# Patient Record
Sex: Male | Born: 1990 | Hispanic: Yes | Marital: Married | Smoking: Never smoker
Health system: Southern US, Community
[De-identification: ages and names within clinical notes are randomized; demographics above are authoritative.]

---

## 2016-09-15 ENCOUNTER — Emergency Department
Admission: EM | Admit: 2016-09-15 | Discharge: 2016-09-15 | Disposition: A | Discharge status: Home / Self Care | Payer: Self-pay | Attending: Emergency Medicine | Admitting: Emergency Medicine

## 2016-09-15 ENCOUNTER — Emergency Department: Payer: Self-pay

## 2016-09-15 DIAGNOSIS — K2970 Gastritis, unspecified, without bleeding: Secondary | ICD-10-CM | POA: Insufficient documentation

## 2016-09-15 LAB — CBC
HEMATOCRIT: 42.9 % (ref 40.0–52.0)
Hemoglobin: 15 g/dL (ref 13.0–18.0)
MCH: 29.5 pg (ref 26.0–34.0)
MCHC: 35 g/dL (ref 32.0–36.0)
MCV: 84.3 fL (ref 80.0–100.0)
Platelets: 244 10*3/uL (ref 150–440)
RBC: 5.09 MIL/uL (ref 4.40–5.90)
RDW: 14 % (ref 11.5–14.5)
WBC: 12.1 10*3/uL — ABNORMAL HIGH (ref 3.8–10.6)

## 2016-09-15 LAB — COMPREHENSIVE METABOLIC PANEL
ALBUMIN: 4.5 g/dL (ref 3.5–5.0)
ALT: 20 U/L (ref 17–63)
ANION GAP: 8 (ref 5–15)
AST: 20 U/L (ref 15–41)
Alkaline Phosphatase: 73 U/L (ref 38–126)
BILIRUBIN TOTAL: 0.6 mg/dL (ref 0.3–1.2)
BUN: 14 mg/dL (ref 6–20)
CO2: 29 mmol/L (ref 22–32)
Calcium: 9.4 mg/dL (ref 8.9–10.3)
Chloride: 103 mmol/L (ref 101–111)
Creatinine, Ser: 1.17 mg/dL (ref 0.61–1.24)
GLUCOSE: 115 mg/dL — AB (ref 65–99)
POTASSIUM: 3.5 mmol/L (ref 3.5–5.1)
Sodium: 140 mmol/L (ref 135–145)
Total Protein: 7.6 g/dL (ref 6.5–8.1)

## 2016-09-15 LAB — TROPONIN I: Troponin I: <0.03 ng/mL (ref <0.03)

## 2016-09-15 MED ORDER — SUCRALFATE 1 G PO TABS: 1.0000 g | ORAL_TABLET | Freq: Four times a day (QID) | ORAL | 0 refills | Status: AC

## 2016-09-15 MED ORDER — FAMOTIDINE 40 MG PO TABS: 40.0000 mg | ORAL_TABLET | Freq: Every evening | ORAL | 1 refills | Status: DC

## 2016-09-15 MED ORDER — GI COCKTAIL ~~LOC~~
30.0000 mL | Freq: Once | ORAL | Status: AC
  Administered 2016-09-15: 30 mL via ORAL
  Filled 2016-09-15: qty 30

## 2016-09-15 NOTE — Discharge Instructions (Signed)
Please seek medical attention for any high fevers, chest pain, shortness of breath, change in behavior, persistent vomiting, bloody stool or any other new or concerning symptoms.  

## 2016-09-15 NOTE — ED Triage Notes (Signed)
Pt in with co left sternal chest pain for a week states worsened at 1800 yest. Hx of the same but has not seem pmd, denies any other symptoms.

## 2016-09-15 NOTE — ED Notes (Signed)
Assessment completed with help of tele-interpreter

## 2016-09-15 NOTE — ED Provider Notes (Signed)
[facility] Emergency Department Provider Note  __________________________________________   I have reviewed the triage vital signs and the nursing notes.   HISTORY  Chief Complaint Chest Pain   History limited by: Language [facility] Interpreter utilized   HPI Colton Shelton is a 26 y.o. male who presents to the emergency department today because of concern for chest pain. Located in the left chest. Pressure like. Started tonight shortly after finishing dinner. By the time of my exam it had improved. The patient states that he had an episode of similar chest pain two weeks ago. At that time it was shorter lived and not as severe.    No past medical history on file.  There are no active problems to display for this patient.   No past surgical history on file.  Prior to Admission medications   Not on File    Allergies Patient has no known allergies.  No family history on file.  Social History Denies smoking Denies alcohol  Review of Systems Constitutional: No fever/chills Eyes: No visual changes. ENT: No sore throat. Cardiovascular: Positive for chest pain. Respiratory: Denies shortness of breath. Gastrointestinal: No abdominal pain.  No nausea, no vomiting.  No diarrhea.   Genitourinary: Negative for dysuria. Musculoskeletal: Negative for back pain. Skin: Negative for rash. Neurological: Negative for headaches, focal weakness or numbness.  ____________________________________________   PHYSICAL EXAM:  VITAL SIGNS: ED Triage Vitals  Enc Vitals Group     BP 09/15/16 0029 (!) 152/88     Pulse Rate 09/15/16 0029 62     Resp 09/15/16 0029 16     Temp 09/15/16 0029 97.8 F (36.6 C)     Temp Source 09/15/16 0029 Oral     SpO2 09/15/16 0029 99 %     Weight 09/15/16 0026 195 lb (88.5 kg)     Height 09/15/16 0026 5\' 8"  (1.727 m)     Head Circumference --      Peak Flow --      Pain Score 09/15/16 0025 6   Constitutional:  Alert and oriented. Well appearing and in no distress. Eyes: Conjunctivae are normal.  ENT   Head: Normocephalic and atraumatic.   Nose: No congestion/rhinnorhea.   Mouth/Throat: Mucous membranes are moist.   Neck: No stridor. Hematological/Lymphatic/Immunilogical: No cervical lymphadenopathy. Cardiovascular: Normal rate, regular rhythm.  No murmurs, rubs, or gallops.  Respiratory: Normal respiratory effort without tachypnea nor retractions. Breath sounds are clear and equal bilaterally. No wheezes/rales/rhonchi. Gastrointestinal: Soft and minimally tender in the left upper quadrant. No rebound. No guarding.  Genitourinary: Deferred Musculoskeletal: Normal range of motion in all extremities. No lower extremity edema. Neurologic:  Normal speech and language. No gross focal neurologic deficits are appreciated.  Skin:  Skin is warm, dry and intact. No rash noted. Psychiatric: Mood and affect are normal. Speech and behavior are normal. Patient exhibits appropriate insight and judgment.  ____________________________________________    LABS (pertinent positives/negatives)  Labs Reviewed  CBC - Abnormal; Notable for the following:       Result Value   WBC 12.1 (*)    All other components within normal limits  COMPREHENSIVE METABOLIC PANEL - Abnormal; Notable for the following:    Glucose, Bld 115 (*)    All other components within normal limits  TROPONIN I    ____________________________________________   EKG  I, Phineas Semen, attending physician, personally viewed and interpreted this EKG  EKG Time: 0022 Rate: 70 Rhythm: normal sinus rhythm Axis: normal Intervals: qtc  408 QRS: narrow ST changes: no st elevation Impression: normal ekg   ____________________________________________    RADIOLOGY  CXR IMPRESSION:  No acute cardiopulmonary process seen.       ____________________________________________   PROCEDURES  Procedures  ____________________________________________   INITIAL IMPRESSION / ASSESSMENT AND PLAN / ED COURSE  Pertinent labs & imaging results that were available during my care of the patient were reviewed by me and considered in my medical decision making (see chart for details).  Patient presented to the emergency department today because of concerns for chest pain. Patient's workup here without concerning findings. He did feel improvement after GI cocktail. At this point I think gastritis likely. Will discharge with sucralfate and Pepcid. Will give patient primary care follow-up information.  ____________________________________________   FINAL CLINICAL IMPRESSION(S) / ED DIAGNOSES  Final diagnoses:  Gastritis, presence of bleeding unspecified, unspecified chronicity, unspecified gastritis type     Note: This dictation was prepared with Dragon dictation. Any transcriptional errors that result from this process are unintentional     Phineas Semen, MD 09/15/16 [phone]

## 2016-12-04 ENCOUNTER — Emergency Department
Admission: EM | Admit: 2016-12-04 | Discharge: 2016-12-04 | Disposition: A | Discharge status: Home / Self Care | Payer: Self-pay | Attending: Student in an Organized Health Care Education/Training Program | Admitting: Student in an Organized Health Care Education/Training Program

## 2016-12-04 ENCOUNTER — Emergency Department: Payer: Self-pay

## 2016-12-04 ENCOUNTER — Encounter: Payer: Self-pay | Admitting: Emergency Medicine

## 2016-12-04 DIAGNOSIS — Z79899 Other long term (current) drug therapy: Secondary | ICD-10-CM | POA: Insufficient documentation

## 2016-12-04 DIAGNOSIS — R079 Chest pain, unspecified: Secondary | ICD-10-CM | POA: Insufficient documentation

## 2016-12-04 DIAGNOSIS — R002 Palpitations: Secondary | ICD-10-CM | POA: Insufficient documentation

## 2016-12-04 DIAGNOSIS — Z563 Stressful work schedule: Secondary | ICD-10-CM | POA: Insufficient documentation

## 2016-12-04 DIAGNOSIS — R0602 Shortness of breath: Secondary | ICD-10-CM | POA: Insufficient documentation

## 2016-12-04 LAB — BASIC METABOLIC PANEL
ANION GAP: 9 (ref 5–15)
BUN: 12 mg/dL (ref 6–20)
CO2: 28 mmol/L (ref 22–32)
Calcium: 9.3 mg/dL (ref 8.9–10.3)
Chloride: 99 mmol/L — ABNORMAL LOW (ref 101–111)
Creatinine, Ser: 0.96 mg/dL (ref 0.61–1.24)
GLUCOSE: 122 mg/dL — AB (ref 65–99)
POTASSIUM: 3.5 mmol/L (ref 3.5–5.1)
SODIUM: 136 mmol/L (ref 135–145)

## 2016-12-04 LAB — CBC
HEMATOCRIT: 45.1 % (ref 40.0–52.0)
HEMOGLOBIN: 15.4 g/dL (ref 13.0–18.0)
MCH: 28.9 pg (ref 26.0–34.0)
MCHC: 34.1 g/dL (ref 32.0–36.0)
MCV: 84.7 fL (ref 80.0–100.0)
Platelets: 266 10*3/uL (ref 150–440)
RBC: 5.33 MIL/uL (ref 4.40–5.90)
RDW: 13.7 % (ref 11.5–14.5)
WBC: 8.9 10*3/uL (ref 3.8–10.6)

## 2016-12-04 LAB — TROPONIN I: Troponin I: <0.03 ng/mL (ref <0.03)

## 2016-12-04 NOTE — ED Notes (Signed)
Pt reports that he is chest pain over the left breast - he states it started early this morning - denies N/V - denies shortness of breath

## 2016-12-04 NOTE — ED Notes (Signed)
Paper copy of discharge instructions signed

## 2016-12-04 NOTE — ED Notes (Signed)
To Radiology via w/c

## 2016-12-04 NOTE — ED Triage Notes (Signed)
Pt reports this am he started with some chest pain. Pt reports pain was pressure like in nature and he felt like he needed to use the BR. Pt reports became hot and nauseated and felt like he could not breathe. Pt reports scared him and he called EMS. Pt reports currently he has no pain but still worried.

## 2016-12-04 NOTE — ED Provider Notes (Signed)
[facility] Emergency Department Provider Note    First MD Initiated Contact with Patient 12/04/16 1319     (approximate)  I have reviewed the triage vital signs and the nursing notes.   HISTORY  Chief Complaint Chest Pain; Shortness of Breath; and Nausea    HPI Colton Shelton is a 26 y.o. male presents with sudden onset chest pain and pressure with palpitations that occurred this morning while he is at work.  Patient states that he has had this episode once before several months ago similar nature.  States that the pain lasted roughly 2-3 minutes and radiated to his left shoulder especially with a feeling of panic.  He spoke to his boss and request to be evaluated by EMS.  Was noted to be hypertensive at that time brought to the ER.  States that he has been working double shift over 13 hours/day for the past several weeks and does feel more stressed.  Denies any abdominal pain.  No nausea or vomiting.  No numbness or tingling.  Does not smoke.  No history of hypertension, hyperlipidemia, diabetes.  Does not drink alcohol.  No cough or fevers.  History reviewed. No pertinent past medical history. No family history on file. History reviewed. No pertinent surgical history. There are no active problems to display for this patient.     Prior to Admission medications   Medication Sig Start Date End Date Taking? Authorizing Provider  famotidine (PEPCID) 40 MG tablet Take 1 tablet (40 mg total) by mouth every evening. 09/15/16 09/15/17  Phineas Semen, MD  sucralfate (CARAFATE) 1 g tablet Take 1 tablet (1 g total) by mouth 4 (four) times daily. 09/15/16   Phineas Semen, MD    Allergies Patient has no known allergies.    Social History Social History   Tobacco Use  . Smoking status: Not on file  Substance Use Topics  . Alcohol use: Not on file  . Drug use: Not on file    Review of Systems Patient denies headaches, rhinorrhea, blurry  vision, numbness, shortness of breath, chest pain, edema, cough, abdominal pain, nausea, vomiting, diarrhea, dysuria, fevers, rashes or hallucinations unless otherwise stated above in HPI. ____________________________________________   PHYSICAL EXAM:  VITAL SIGNS: Vitals:   12/04/16 1004  BP: (!) 159/92  Pulse: 77  Resp: 20  Temp: 98.1 F (36.7 C)  SpO2: 98%    Constitutional: Alert and oriented. Well appearing and in no acute distress. Eyes: Conjunctivae are normal.  Head: Atraumatic. Nose: No congestion/rhinnorhea. Mouth/Throat: Mucous membranes are moist.   Neck: No stridor. Painless ROM.  Cardiovascular: Normal rate, regular rhythm. Grossly normal heart sounds.  Good peripheral circulation. Respiratory: Normal respiratory effort.  No retractions. Lungs CTAB. Gastrointestinal: Soft and nontender. No distention. No abdominal bruits. No CVA tenderness. Genitourinary:  Musculoskeletal: No lower extremity tenderness nor edema.  No joint effusions. Neurologic:  Normal speech and language. No gross focal neurologic deficits are appreciated. No facial droop Skin:  Skin is warm, dry and intact. No rash noted. Psychiatric: Mood and affect are normal. Speech and behavior are normal.  ____________________________________________   LABS (all labs ordered are listed, but only abnormal results are displayed)  Results for orders placed or performed during the hospital encounter of 12/04/16 (from the past 24 hour(s))  Basic metabolic panel     Status: Abnormal   Collection Time: 12/04/16 10:01 AM  Result Value Ref Range   Sodium 136 135 - 145 mmol/L   Potassium  3.5 3.5 - 5.1 mmol/L   Chloride 99 (L) 101 - 111 mmol/L   CO2 28 22 - 32 mmol/L   Glucose, Bld 122 (H) 65 - 99 mg/dL   BUN 12 6 - 20 mg/dL   Creatinine, Ser 1.610.96 0.61 - 1.24 mg/dL   Calcium 9.3 8.9 - 09.610.3 mg/dL   GFR calc non Af Amer >60 >60 mL/min   GFR calc Af Amer >60 >60 mL/min   Anion gap 9 5 - 15  CBC     Status:  None   Collection Time: 12/04/16 10:01 AM  Result Value Ref Range   WBC 8.9 3.8 - 10.6 K/uL   RBC 5.33 4.40 - 5.90 MIL/uL   Hemoglobin 15.4 13.0 - 18.0 g/dL   HCT 04.545.1 40.940.0 - 81.152.0 %   MCV 84.7 80.0 - 100.0 fL   MCH 28.9 26.0 - 34.0 pg   MCHC 34.1 32.0 - 36.0 g/dL   RDW 91.413.7 78.211.5 - 95.614.5 %   Platelets 266 150 - 440 K/uL  Troponin I     Status: None   Collection Time: 12/04/16 10:01 AM  Result Value Ref Range   Troponin I <0.03 <0.03 ng/mL   ____________________________________________  EKG My review and personal interpretation at Time: 10:00   Indication: chest pain  Rate: 80  Rhythm: sinus Axis: normal Other:  Normal intervlas, no stemi, poor r wave progression ____________________________________________  RADIOLOGY  I personally reviewed all radiographic images ordered to evaluate for the above acute complaints and reviewed radiology reports and findings.  These findings were personally discussed with the patient.  Please see medical record for radiology report.  ____________________________________________   PROCEDURES  Procedure(s) performed:  Procedures    Critical Care performed: no ____________________________________________   INITIAL IMPRESSION / ASSESSMENT AND PLAN / ED COURSE  Pertinent labs & imaging results that were available during my care of the patient were reviewed by me and considered in my medical decision making (see chart for details).  DDX: ACS, pericarditis, esophagitis, boerhaaves, pe, dissection, pna, bronchitis, costochondritis   Minta Balsamdwin Colton Shelton is a 26 y.o. who presents to the ED with chest pains and palpitations as described above.  He is well-appearing in no acute distress.  EKG shows no acute ischemia.  His troponin is negative.  He has equal pulses throughout.  Not clinically consistent with dissection.  His abdominal exam is soft and benign.  He is a low risk heart score of 1.  He is low risk by Wells criteria and is PERC  negative.  Do suspect some component of stress-induced discomfort.  Possible palpitations but is in a normal sinus at this time.  Patient is low risk profile do believe that he stable and appropriate for further workup as an outpatient.      ____________________________________________   FINAL CLINICAL IMPRESSION(S) / ED DIAGNOSES  Final diagnoses:  Chest pain, unspecified type  Palpitations      NEW MEDICATIONS STARTED DURING THIS VISIT:  This SmartLink is deprecated. Use AVSMEDLIST instead to display the medication list for a patient.   Note:  This document was prepared using Dragon voice recognition software and may include unintentional dictation errors.    Willy Eddyobinson, Lashante Fryberger, MD 12/04/16 [phone]

## 2016-12-07 ENCOUNTER — Emergency Department
Admission: EM | Admit: 2016-12-07 | Discharge: 2016-12-07 | Disposition: A | Discharge status: Home / Self Care | Payer: Self-pay | Attending: Emergency Medicine | Admitting: Emergency Medicine

## 2016-12-07 ENCOUNTER — Encounter: Payer: Self-pay | Admitting: Emergency Medicine

## 2016-12-07 ENCOUNTER — Other Ambulatory Visit: Payer: Self-pay

## 2016-12-07 DIAGNOSIS — R6883 Chills (without fever): Secondary | ICD-10-CM | POA: Insufficient documentation

## 2016-12-07 DIAGNOSIS — F419 Anxiety disorder, unspecified: Secondary | ICD-10-CM | POA: Insufficient documentation

## 2016-12-07 DIAGNOSIS — M791 Myalgia, unspecified site: Secondary | ICD-10-CM | POA: Insufficient documentation

## 2016-12-07 MED ORDER — LORAZEPAM 0.5 MG PO TABS: 0.5000 mg | ORAL_TABLET | Freq: Three times a day (TID) | ORAL | 0 refills | Status: AC | PRN

## 2016-12-07 MED ORDER — LORAZEPAM 1 MG PO TABS
1.0000 mg | ORAL_TABLET | Freq: Once | ORAL | Status: AC
  Administered 2016-12-07: 1 mg via ORAL
  Filled 2016-12-07: qty 1

## 2016-12-07 NOTE — ED Notes (Signed)

## 2016-12-07 NOTE — Discharge Instructions (Signed)
These make an appointment to establish care with the therapist tomorrow for reevaluation.  Return to the emergency department sooner for any concerns whatsoever.  It was a pleasure to take care of you today, and thank you for coming to our emergency department.  If you have any questions or concerns before leaving please ask the nurse to grab me and I'm more than happy to go through your aftercare instructions again.  If you were prescribed any opioid pain medication today such as Norco, Vicodin, Percocet, morphine, hydrocodone, or oxycodone please make sure you do not drive when you are taking this medication as it can alter your ability to drive safely.  If you have any concerns once you are home that you are not improving or are in fact getting worse before you can make it to your follow-up appointment, please do not hesitate to call 911 and come back for further evaluation.  Merrily BrittleNeil Menachem Urbanek, MD  Results for orders placed or performed during the hospital encounter of 12/04/16  Basic metabolic panel  Result Value Ref Range   Sodium 136 135 - 145 mmol/L   Potassium 3.5 3.5 - 5.1 mmol/L   Chloride 99 (L) 101 - 111 mmol/L   CO2 28 22 - 32 mmol/L   Glucose, Bld 122 (H) 65 - 99 mg/dL   BUN 12 6 - 20 mg/dL   Creatinine, Ser 1.610.96 0.61 - 1.24 mg/dL   Calcium 9.3 8.9 - 09.610.3 mg/dL   GFR calc non Af Amer >60 >60 mL/min   GFR calc Af Amer >60 >60 mL/min   Anion gap 9 5 - 15  CBC  Result Value Ref Range   WBC 8.9 3.8 - 10.6 K/uL   RBC 5.33 4.40 - 5.90 MIL/uL   Hemoglobin 15.4 13.0 - 18.0 g/dL   HCT 04.545.1 40.940.0 - 81.152.0 %   MCV 84.7 80.0 - 100.0 fL   MCH 28.9 26.0 - 34.0 pg   MCHC 34.1 32.0 - 36.0 g/dL   RDW 91.413.7 78.211.5 - 95.614.5 %   Platelets 266 150 - 440 K/uL  Troponin I  Result Value Ref Range   Troponin I <0.03 <0.03 ng/mL   Dg Chest 2 View  Result Date: 12/04/2016 CLINICAL DATA:  Left-sided chest pressure today, some visual change EXAM: CHEST  2 VIEW COMPARISON:  Chest x-ray of 09/15/2016  FINDINGS: No active infiltrate or effusion is seen. Slightly prominent perihilar markings could indicate bronchitis. Mediastinal and hilar contours otherwise are unremarkable. The heart is within normal limits in size. No bony abnormality is seen. IMPRESSION: No pneumonia or effusion.  Cannot exclude bronchitis. Electronically Signed   By: Dwyane DeePaul  Barry M.D.   On: 12/04/2016 10:50

## 2016-12-07 NOTE — ED Notes (Signed)
Pt was assisted to the restroom by this EDT  

## 2016-12-07 NOTE — ED Provider Notes (Signed)
[facility] Emergency Department Provider Note  ____________________________________________   First MD Initiated Contact with Patient 12/07/16 0159     (approximate)  I have reviewed the triage vital signs and the nursing notes.   HISTORY  Chief Complaint Chills and Generalized Body Aches   HPI Colton Shelton is a 26 y.o. male who self presents to the emergency department with sudden onset anxiety, muscle cramps, malaise, and generally feeling "unwell" that began roughly an hour prior to arrival.  He denies chest pain.  He does report some shortness of breath.  He was here in the emergency department 3 days ago with similar symptoms although he did have chest pain at that time.  He ruled out for myocardial infarction and was given cardiology follow-up.  He is currently in no pain.  His symptoms are non-positional.  There are sudden onset have been constant.  They seem to be worse with stress improved when he takes deep breaths.  He does drink a large amount of coffee.  He denies cocaine or other illicit drug use.  He has been under a tremendous amount of stress at home recently.    History reviewed. No pertinent past medical history.  There are no active problems to display for this patient.   History reviewed. No pertinent surgical history.  Prior to Admission medications   Medication Sig Start Date End Date Taking? Authorizing Provider  famotidine (PEPCID) 40 MG tablet Take 1 tablet (40 mg total) by mouth every evening. 09/15/16 09/15/17  Phineas SemenGoodman, Graydon, MD  sucralfate (CARAFATE) 1 g tablet Take 1 tablet (1 g total) by mouth 4 (four) times daily. 09/15/16   Phineas SemenGoodman, Graydon, MD    Allergies Patient has no known allergies.  No family history on file.  Social History Social History   Tobacco Use  . Smoking status: Never Smoker  . Smokeless tobacco: Never Used  Substance Use Topics  . Alcohol use: No    Frequency: Never  . Drug  use: Not on file    Review of Systems Constitutional: No fever/chills ENT: No sore throat. Cardiovascular: Denies chest pain. Respiratory: Positive for shortness of breath. Gastrointestinal: No abdominal pain.  No nausea, no vomiting.  No diarrhea.  No constipation. Musculoskeletal: Negative for back pain. Neurological: Negative for headaches   ____________________________________________   PHYSICAL EXAM:  VITAL SIGNS: ED Triage Vitals [12/07/16 0152]  Enc Vitals Group     BP      Pulse      Resp      Temp      Temp src      SpO2      Weight 200 lb (90.7 kg)     Height 5\' 8"  (1.727 m)     Head Circumference      Peak Flow      Pain Score 3     Pain Loc      Pain Edu?      Excl. in GC?     Constitutional: Alert and oriented x4 pressured speech somewhat anxious appearing ringing his hands Head: Atraumatic. Nose: No congestion/rhinnorhea. Mouth/Throat: No trismus Neck: No stridor.   Cardiovascular: Regular rate rhythm no murmurs Respiratory: Normal respiratory effort.  No retractions. Gastrointestinal: Soft nontender Neurologic: Pressured speech no gross focal neurologic deficits are appreciated.  Skin:  Skin is warm, dry and intact. No rash noted.    ____________________________________________  LABS (all labs ordered are listed, but only abnormal results are displayed)  Labs Reviewed -  No data to display   __________________________________________  EKG  ED ECG REPORT I, Merrily BrittleNeil Reza Crymes, the attending physician, personally viewed and interpreted this ECG.  Date: 12/07/2016 EKG Time:  Rate: 89 Rhythm: normal sinus rhythm QRS Axis: normal Intervals: normal ST/T Wave abnormalities: normal Narrative Interpretation: no evidence of acute ischemia  ____________________________________________  RADIOLOGY   ____________________________________________   DIFFERENTIAL includes but not limited to  Panic attack, anxiety, pericarditis, myocarditis,  acute coronary syndrome, pneumonia, pneumothorax   PROCEDURES  Procedure(s) performed: no  Procedures  Critical Care performed: no  Observation: no ____________________________________________   INITIAL IMPRESSION / ASSESSMENT AND PLAN / ED COURSE  Pertinent labs & imaging results that were available during my care of the patient were reviewed by me and considered in my medical decision making (see chart for details).  The patient arrives somewhat anxious appearing with pressured speech.  He describes numbness and tingling worse with hyperventilation.  I had a lengthy discussion with the patient regarding his symptoms and the stress he is having at home.  Given 1 mg of lorazepam which greatly improved his symptoms.  I will discharge him with a short course of lorazepam for breakthrough symptoms as well as [facility] referral for therapy.  The patient verbalizes understanding and agreement with plan.      ____________________________________________   FINAL CLINICAL IMPRESSION(S) / ED DIAGNOSES  Final diagnoses:  Anxiety      NEW MEDICATIONS STARTED DURING THIS VISIT:  This SmartLink is deprecated. Use AVSMEDLIST instead to display the medication list for a patient.   Note:  This document was prepared using Dragon voice recognition software and may include unintentional dictation errors.      Merrily Brittleifenbark, Keymarion Bearman, MD 12/07/16 [phone]

## 2016-12-07 NOTE — ED Triage Notes (Addendum)
Patient ambulatory to triage with steady gait, without difficulty or distress noted; pt reports awoke PTA with chills, body aches; denies any recent symptoms but reports he was here 3 days ago for CP and findings WNL; took 2 ASA PTA

## 2017-04-23 ENCOUNTER — Encounter: Payer: Self-pay | Admitting: Emergency Medicine

## 2017-04-23 ENCOUNTER — Other Ambulatory Visit: Payer: Self-pay

## 2017-04-23 ENCOUNTER — Emergency Department: Payer: Self-pay

## 2017-04-23 ENCOUNTER — Emergency Department
Admission: EM | Admit: 2017-04-23 | Discharge: 2017-04-23 | Disposition: A | Discharge status: Home / Self Care | Payer: Self-pay | Attending: Emergency Medicine | Admitting: Emergency Medicine

## 2017-04-23 DIAGNOSIS — Z79899 Other long term (current) drug therapy: Secondary | ICD-10-CM | POA: Insufficient documentation

## 2017-04-23 DIAGNOSIS — R0789 Other chest pain: Secondary | ICD-10-CM | POA: Insufficient documentation

## 2017-04-23 DIAGNOSIS — R55 Syncope and collapse: Secondary | ICD-10-CM | POA: Insufficient documentation

## 2017-04-23 LAB — CBC
HCT: 44.1 % (ref 40.0–52.0)
Hemoglobin: 14.9 g/dL (ref 13.0–18.0)
MCH: 28.9 pg (ref 26.0–34.0)
MCHC: 33.8 g/dL (ref 32.0–36.0)
MCV: 85.5 fL (ref 80.0–100.0)
PLATELETS: 256 10*3/uL (ref 150–440)
RBC: 5.15 MIL/uL (ref 4.40–5.90)
RDW: 13.9 % (ref 11.5–14.5)
WBC: 9.1 10*3/uL (ref 3.8–10.6)

## 2017-04-23 LAB — BASIC METABOLIC PANEL
Anion gap: 8 (ref 5–15)
BUN: 13 mg/dL (ref 6–20)
CO2: 28 mmol/L (ref 22–32)
CREATININE: 0.9 mg/dL (ref 0.61–1.24)
Calcium: 9.3 mg/dL (ref 8.9–10.3)
Chloride: 102 mmol/L (ref 101–111)
GFR calc Af Amer: >60 mL/min (ref >60)
Glucose, Bld: 108 mg/dL — ABNORMAL HIGH (ref 65–99)
Potassium: 4.1 mmol/L (ref 3.5–5.1)
SODIUM: 138 mmol/L (ref 135–145)

## 2017-04-23 LAB — TROPONIN I: Troponin I: <0.03 ng/mL (ref <0.03)

## 2017-04-23 MED ORDER — IBUPROFEN 600 MG PO TABS: 600.0000 mg | ORAL_TABLET | Freq: Four times a day (QID) | ORAL | 0 refills | Status: AC | PRN

## 2017-04-23 MED ORDER — FAMOTIDINE 20 MG PO TABS: 20.0000 mg | ORAL_TABLET | Freq: Two times a day (BID) | ORAL | 0 refills | Status: AC

## 2017-04-23 NOTE — ED Notes (Signed)
ED Provider at bedside. 

## 2017-04-23 NOTE — ED Triage Notes (Signed)
Pt to ed with c/o left sided chest pain that started at 12 noon today and then became weak and felt that he might faint.  Pt states arms and hands became sweaty and had tingling in them bilat,  Pt states he then took 4 baby asa.

## 2017-04-23 NOTE — ED Provider Notes (Signed)
[facility] Emergency Department Provider Note ____________________________________________   First MD Initiated Contact with Patient 04/23/17 1929     (approximate)  I have reviewed the triage vital signs and the nursing notes.   HISTORY  Chief Complaint Chest Pain  History of present illness obtained via in person Spanish interpreter  HPI Colton Shelton is a 27 y.o. male with no significant past medical history who presents with chest pain, acute onset approximately 6 hours ago while the patient was driving, substernal and left-sided, sharp, and associated with a brief episode of lightheadedness and near syncope.  Patient states that the pain has now mostly resolved.  He also reports over the last several days when he wakes up he has paresthesias in his arms and his legs and he becomes sweaty, and this resolves immediately after getting up.  He denies prior history of this chest pain.  He also reports that he has some chest discomfort and upper abdominal discomfort at night especially if he eats late before going to sleep.  History reviewed. No pertinent past medical history.  There are no active problems to display for this patient.   History reviewed. No pertinent surgical history.  Prior to Admission medications   Medication Sig Start Date End Date Taking? Authorizing Provider  famotidine (PEPCID) 20 MG tablet Take 1 tablet (20 mg total) by mouth 2 (two) times daily. 04/23/17 05/23/17  Dionne Bucy, MD  ibuprofen (ADVIL,MOTRIN) 600 MG tablet Take 1 tablet (600 mg total) by mouth every 6 (six) hours as needed (Pain). 04/23/17   Dionne Bucy, MD  sucralfate (CARAFATE) 1 g tablet Take 1 tablet (1 g total) by mouth 4 (four) times daily. 09/15/16   Phineas Semen, MD    Allergies Patient has no known allergies.  No family history on file.  Social History Social History   Tobacco Use  . Smoking status: Never Smoker  .  Smokeless tobacco: Never Used  Substance Use Topics  . Alcohol use: No    Frequency: Never  . Drug use: Never    Review of Systems  Constitutional: No fever. Eyes: No visual changes. ENT: No sore throat. Cardiovascular: Positive for chest pain. Respiratory: Denies shortness of breath. Gastrointestinal: No vomiting. Genitourinary: Negative for flank pain.  Musculoskeletal: Negative for back pain. Skin: Negative for rash. Neurological: Negative for headache.   ____________________________________________   PHYSICAL EXAM:  VITAL SIGNS: ED Triage Vitals  Enc Vitals Group     BP 04/23/17 1608 (!) 141/96     Pulse Rate 04/23/17 1608 71     Resp 04/23/17 1933 18     Temp 04/23/17 1608 98.7 F (37.1 C)     Temp Source 04/23/17 1608 Oral     SpO2 04/23/17 1608 97 %     Weight 04/23/17 1623 192 lb (87.1 kg)     Height --      Head Circumference --      Peak Flow --      Pain Score 04/23/17 1623 4     Pain Loc --      Pain Edu? --      Excl. in [place]? --     Constitutional: Alert and oriented. Well appearing and in no acute distress. Eyes: Conjunctivae are normal.  Head: Atraumatic. Nose: No congestion/rhinnorhea. Mouth/Throat: Mucous membranes are moist.   Neck: Normal range of motion.  Cardiovascular: Normal rate, regular rhythm. Grossly normal heart sounds.  Good peripheral circulation. Respiratory: Normal respiratory effort.  No retractions. Lungs CTAB. Gastrointestinal: No distention.  Musculoskeletal: No lower extremity edema.  Extremities warm and well perfused.  Neurologic:  Normal speech and language.  5 out of 5 motor strength and intact sensation all extremities.  Normal coordination.  No gross focal neurologic deficits are appreciated.  Skin:  Skin is warm and dry. No rash noted. Psychiatric: Mood and affect are normal. Speech and behavior are normal.  ____________________________________________   LABS (all labs ordered are listed, but only abnormal  results are displayed)  Labs Reviewed  BASIC METABOLIC PANEL - Abnormal; Notable for the following components:      Result Value   Glucose, Bld 108 (*)    All other components within normal limits  CBC  TROPONIN I   ____________________________________________  EKG  ED ECG REPORT I, Dionne BucySebastian Darnetta Kesselman, the attending physician, personally viewed and interpreted this ECG.  Date: 04/23/2017 EKG Time: 1615 Rate: 81 Rhythm: normal sinus rhythm QRS Axis: normal Intervals: normal ST/T Wave abnormalities: normal Narrative Interpretation: no evidence of acute ischemia  ____________________________________________  RADIOLOGY  CXR: No focal infiltrate or other acute findings  ____________________________________________   PROCEDURES  Procedure(s) performed: No  Procedures  Critical Care performed: No ____________________________________________   INITIAL IMPRESSION / ASSESSMENT AND PLAN / ED COURSE  Pertinent labs & imaging results that were available during my care of the patient were reviewed by me and considered in my medical decision making (see chart for details).  27 year old male with no significant past medical history presents with an episode of atypical chest pain, associated with a near syncopal event today with prodrome of lightheadedness.  Patient also reports some intermittent paresthesias to extremities especially when he wakes up.  And, he reports GERD-like chest and upper abdominal discomfort after eating intermittently.  On exam, vital signs are normal, the patient is extremely well-appearing, and the remainder the exam is unremarkable.  EKG is normal, and the chest x-ray and initial labs are unremarkable.  Overall presentation is consistent with benign causes such as a vasovagal episode, musculoskeletal pain, radiculopathy, or other benign cause.  I also suspect the patient is experiencing some GERD especially at night.  The patient has no risk factors  for PE and is PERC negative.  There is no evidence of vascular etiology or any arrhythmia.  Given the time since onset and the extremely low risk of ACS, there is no indication for repeat troponin.  I counseled the patient on possible etiologies, use of H2 blocker for GERD, and on return precautions via in person Spanish interpreter.  The patient expressed understanding, and I answered all of his questions.     ____________________________________________   FINAL CLINICAL IMPRESSION(S) / ED DIAGNOSES  Final diagnoses:  Atypical chest pain  Near syncope      NEW MEDICATIONS STARTED DURING THIS VISIT:  Discharge Medication List as of 04/23/2017  8:15 PM    START taking these medications   Details  ibuprofen (ADVIL,MOTRIN) 600 MG tablet Take 1 tablet (600 mg total) by mouth every 6 (six) hours as needed (Pain)., Starting Tue 04/23/2017, Print         Note:  This document was prepared using Dragon voice recognition software and may include unintentional dictation errors.    Dionne BucySiadecki, Andren Bethea, MD 04/23/17 [phone]

## 2018-03-06 ENCOUNTER — Encounter: Payer: Self-pay | Admitting: Emergency Medicine

## 2018-03-06 ENCOUNTER — Emergency Department
Admission: EM | Admit: 2018-03-06 | Discharge: 2018-03-06 | Disposition: A | Discharge status: Home / Self Care | Payer: BLUE CROSS/BLUE SHIELD | Attending: Emergency Medicine | Admitting: Emergency Medicine

## 2018-03-06 DIAGNOSIS — J111 Influenza due to unidentified influenza virus with other respiratory manifestations: Secondary | ICD-10-CM | POA: Insufficient documentation

## 2018-03-06 DIAGNOSIS — R51 Headache: Secondary | ICD-10-CM | POA: Diagnosis present

## 2018-03-06 MED ORDER — OSELTAMIVIR PHOSPHATE 75 MG PO CAPS: 75.0000 mg | ORAL_CAPSULE | Freq: Two times a day (BID) | ORAL | 0 refills | Status: AC

## 2018-03-06 NOTE — Discharge Instructions (Addendum)
Follow-up can no clinic acute care if any continued problems.  Begin taking Tamiflu twice a day for the next 5 days.  Tylenol or ibuprofen as needed for body aches, headache and fever.  Increase fluids.

## 2018-03-06 NOTE — ED Notes (Signed)
See triage note  Presents with subjective fever this am and headache.states he feels like his hands and feet are cold   States pain is mainly to right side of head  Occasional nausea  No vomiting

## 2018-03-06 NOTE — ED Provider Notes (Signed)
[facility] Emergency Department Provider Note  ____________________________________________   First MD Initiated Contact with Patient 03/06/18 1252     (approximate)  I have reviewed the triage vital signs and the nursing notes.   HISTORY  Chief Complaint Headache and Generalized Body Aches  HPI Colton Shelton is a 28 y.o. male   presents to the ED with complaint of sudden onset of headache this morning along with body aches and feeling chills.  Patient has subjectively same he has fever.  He also reports some nausea without vomiting.  He has been exposed to the flu numerous times.  Patient denies getting the flu vaccine.  He rates his body aches as a 10/10.   History reviewed. No pertinent past medical history.  There are no active problems to display for this patient.   History reviewed. No pertinent surgical history.  Prior to Admission medications   Medication Sig Start Date End Date Taking? Authorizing Provider  famotidine (PEPCID) 20 MG tablet Take 1 tablet (20 mg total) by mouth 2 (two) times daily. 04/23/17 05/23/17  Dionne BucySiadecki, Sebastian, MD  ibuprofen (ADVIL,MOTRIN) 600 MG tablet Take 1 tablet (600 mg total) by mouth every 6 (six) hours as needed (Pain). 04/23/17   Dionne BucySiadecki, Sebastian, MD  oseltamivir (TAMIFLU) 75 MG capsule Take 1 capsule (75 mg total) by mouth 2 (two) times daily for 5 days. 03/06/18 03/11/18  Tommi RumpsSummers, Nickie Deren L, PA-C  sucralfate (CARAFATE) 1 g tablet Take 1 tablet (1 g total) by mouth 4 (four) times daily. 09/15/16   Phineas SemenGoodman, Graydon, MD    Allergies Patient has no known allergies.  No family history on file.  Social History Social History   Tobacco Use  . Smoking status: Never Smoker  . Smokeless tobacco: Never Used  Substance Use Topics  . Alcohol use: No    Frequency: Never  . Drug use: Never    Review of Systems Constitutional: Subjective fever/chills Eyes: No visual changes. ENT: No sore  throat. Cardiovascular: Denies chest pain. Respiratory: Denies shortness of breath.  Positive for cough. Gastrointestinal: No abdominal pain.  Positive nausea, no vomiting.  No diarrhea.  No constipation. Genitourinary: Negative for dysuria. Musculoskeletal: Positive for muscle aches. Skin: Negative for rash. Neurological: Positive for headaches, negative for focal weakness or numbness. ___________________________________________   PHYSICAL EXAM:  VITAL SIGNS: ED Triage Vitals  Enc Vitals Group     BP --      Pulse Rate 03/06/18 1224 69     Resp 03/06/18 1224 20     Temp 03/06/18 1224 98.5 F (36.9 C)     Temp Source 03/06/18 1224 Oral     SpO2 03/06/18 1224 100 %     Weight 03/06/18 1225 210 lb (95.3 kg)     Height 03/06/18 1225 5\' 3"  (1.6 m)     Head Circumference --      Peak Flow --      Pain Score 03/06/18 1225 10     Pain Loc --      Pain Edu? --      Excl. in [place]? --    Constitutional: Alert and oriented. Well appearing and in no acute distress. Eyes: Conjunctivae are normal.  Head: Atraumatic. Nose: No congestion/rhinnorhea.  TMs are dull bilaterally. Mouth/Throat: Mucous membranes are moist.  Oropharynx non-erythematous. Neck: No stridor.   Hematological/Lymphatic/Immunilogical: No cervical lymphadenopathy. Cardiovascular: Normal rate, regular rhythm. Grossly normal heart sounds.  Good peripheral circulation. Respiratory: Normal respiratory effort.  No retractions.  Lungs CTAB. Musculoskeletal: Moves upper and lower extremities without any difficulty.  Normal gait was noted. Neurologic:  Normal speech and language. No gross focal neurologic deficits are appreciated. No gait instability. Skin:  Skin is warm, dry and intact. No rash noted. Psychiatric: Mood and affect are normal. Speech and behavior are normal.  ____________________________________________   LABS (all labs ordered are listed, but only abnormal results are displayed)  Labs Reviewed - No data to  display   PROCEDURES  Procedure(s) performed: None  Procedures  Critical Care performed: No  ____________________________________________   INITIAL IMPRESSION / ASSESSMENT AND PLAN / ED COURSE  As part of my medical decision making, I reviewed the following data within the electronic MEDICAL RECORD NUMBER Notes from prior ED visits and [place] Controlled Substance Database  Patient presents to the ED with symptoms of influenza.  Patient states that symptoms started this morning.  He has been exposed numerous times both at work and in his personal life.  Physical exam was unremarkable.  Patient was started on Tamiflu 75 mg twice daily for 5 days.  He is encouraged to increase fluids and also take Tylenol or ibuprofen as needed for fever or body aches.  ____________________________________________   FINAL CLINICAL IMPRESSION(S) / ED DIAGNOSES  Final diagnoses:  Influenza     ED Discharge Orders         Ordered    oseltamivir (TAMIFLU) 75 MG capsule  2 times daily     03/06/18 1320           Note:  This document was prepared using Dragon voice recognition software and may include unintentional dictation errors.    Tommi Rumps, PA-C 03/06/18 1448    Sharman Cheek, MD 03/06/18 1520

## 2018-03-06 NOTE — ED Triage Notes (Signed)
Patient presents to the ED with headache this morning and pain to his hands and feet.  Patient states, "It felt like ice on my hands and feet."  Patient reports nausea earlier this morning.  Patient states the flu has been going around at work.  Patient is in no obvious distress at this time.

## 2018-08-21 ENCOUNTER — Emergency Department
Admission: EM | Admit: 2018-08-21 | Discharge: 2018-08-21 | Disposition: A | Discharge status: Home / Self Care | Payer: BLUE CROSS/BLUE SHIELD | Attending: Emergency Medicine | Admitting: Emergency Medicine

## 2018-08-21 ENCOUNTER — Other Ambulatory Visit: Payer: Self-pay

## 2018-08-21 ENCOUNTER — Encounter: Payer: Self-pay | Admitting: Emergency Medicine

## 2018-08-21 DIAGNOSIS — R55 Syncope and collapse: Secondary | ICD-10-CM | POA: Diagnosis not present

## 2018-08-21 DIAGNOSIS — Z79899 Other long term (current) drug therapy: Secondary | ICD-10-CM | POA: Insufficient documentation

## 2018-08-21 LAB — HEPATIC FUNCTION PANEL
ALT: 29 U/L (ref 0–44)
AST: 21 U/L (ref 15–41)
Albumin: 5 g/dL (ref 3.5–5.0)
Alkaline Phosphatase: 75 U/L (ref 38–126)
Bilirubin, Direct: <0.1 mg/dL (ref 0.0–0.2)
Total Bilirubin: 0.5 mg/dL (ref 0.3–1.2)
Total Protein: 8.4 g/dL — ABNORMAL HIGH (ref 6.5–8.1)

## 2018-08-21 LAB — URINALYSIS, COMPLETE (UACMP) WITH MICROSCOPIC
Bacteria, UA: NONE SEEN
Bilirubin Urine: NEGATIVE
Glucose, UA: NEGATIVE mg/dL
Hgb urine dipstick: NEGATIVE
Ketones, ur: NEGATIVE mg/dL
Leukocytes,Ua: NEGATIVE
Nitrite: NEGATIVE
Protein, ur: NEGATIVE mg/dL
Specific Gravity, Urine: 1.019 (ref 1.005–1.030)
WBC, UA: NONE SEEN WBC/hpf (ref 0–5)
pH: 7 (ref 5.0–8.0)

## 2018-08-21 LAB — LIPASE, BLOOD: Lipase: 38 U/L (ref 11–51)

## 2018-08-21 LAB — CBC
HCT: 42.5 % (ref 39.0–52.0)
Hemoglobin: 14.7 g/dL (ref 13.0–17.0)
MCH: 29.1 pg (ref 26.0–34.0)
MCHC: 34.6 g/dL (ref 30.0–36.0)
MCV: 84 fL (ref 80.0–100.0)
Platelets: 267 10*3/uL (ref 150–400)
RBC: 5.06 MIL/uL (ref 4.22–5.81)
RDW: 13.2 % (ref 11.5–15.5)
WBC: 10.9 10*3/uL — ABNORMAL HIGH (ref 4.0–10.5)
nRBC: 0 % (ref 0.0–0.2)

## 2018-08-21 LAB — BASIC METABOLIC PANEL
Anion gap: 6 (ref 5–15)
BUN: 14 mg/dL (ref 6–20)
CO2: 29 mmol/L (ref 22–32)
Calcium: 9.6 mg/dL (ref 8.9–10.3)
Chloride: 104 mmol/L (ref 98–111)
Creatinine, Ser: 1 mg/dL (ref 0.61–1.24)
GFR calc Af Amer: >60 mL/min (ref >60)
GFR calc non Af Amer: >60 mL/min (ref >60)
Glucose, Bld: 102 mg/dL — ABNORMAL HIGH (ref 70–99)
Potassium: 4.1 mmol/L (ref 3.5–5.1)
Sodium: 139 mmol/L (ref 135–145)

## 2018-08-21 LAB — TROPONIN I (HIGH SENSITIVITY): Troponin I (High Sensitivity): 2 ng/L (ref <18)

## 2018-08-21 MED ORDER — SODIUM CHLORIDE 0.9% FLUSH: 3.0000 mL | Freq: Once | INTRAVENOUS | Status: DC

## 2018-08-21 NOTE — ED Triage Notes (Signed)
First RN Note: Pt presents to ED via [facility] from work, with c/o painful chest. 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 Per EMS pt c/o dizziness today. EMS reports pt treated for Covid 2 months ago.    98.7 174/112 96% RA 96 NSR elevation in V2 V3 V4 per EMS

## 2018-08-21 NOTE — ED Provider Notes (Signed)
[facility] Emergency Department Provider Note  Time seen: 5:59 PM  I have reviewed the triage vital signs and the nursing notes.   HISTORY  Chief Complaint Near Syncope   HPI Colton Shelton is a 28 y.o. male with no significant past medical history presents to the emergency department after a near syncopal episode.  According to the patient he was at work when he began feeling very lightheaded and dizzy.  Spanish interpreter used for this evaluation, patient specifically denies any chest pain.  States mild upper abdominal discomfort since yesterday when he got hit by a box in the abdomen.  Denies any fever or shortness of breath.  Patient recovered from coronavirus approximately 2 months ago.  History reviewed. No pertinent past medical history.  There are no active problems to display for this patient.   History reviewed. No pertinent surgical history.  Prior to Admission medications   Medication Sig Start Date End Date Taking? Authorizing Provider  famotidine (PEPCID) 20 MG tablet Take 1 tablet (20 mg total) by mouth 2 (two) times daily. 04/23/17 05/23/17  Arta Silence, MD  ibuprofen (ADVIL,MOTRIN) 600 MG tablet Take 1 tablet (600 mg total) by mouth every 6 (six) hours as needed (Pain). 04/23/17   Arta Silence, MD  sucralfate (CARAFATE) 1 g tablet Take 1 tablet (1 g total) by mouth 4 (four) times daily. 09/15/16   Nance Pear, MD    No Known Allergies  No family history on file.  Social History Social History   Tobacco Use  . Smoking status: Never Smoker  . Smokeless tobacco: Never Used  Substance Use Topics  . Alcohol use: No    Frequency: Never  . Drug use: Never    Review of Systems Constitutional: Negative for fever.  Lightheadedness. Cardiovascular: Negative for chest pain. Respiratory: Negative for shortness of breath. Gastrointestinal: Mild upper abdominal pain.  Negative for vomiting  Musculoskeletal:  Negative for musculoskeletal complaints Skin: Negative for skin complaints  Neurological: Negative for headache All other ROS negative  ____________________________________________   PHYSICAL EXAM:  VITAL SIGNS: ED Triage Vitals  Enc Vitals Group     BP 08/21/18 1612 (!) 153/98     Pulse Rate 08/21/18 1612 83     Resp --      Temp 08/21/18 1612 98.6 F (37 C)     Temp Source 08/21/18 1612 Oral     SpO2 08/21/18 1612 98 %     Weight 08/21/18 1619 210 lb (95.3 kg)     Height 08/21/18 1619 5\' 7"  (1.702 m)     Head Circumference --      Peak Flow --      Pain Score --      Pain Loc --      Pain Edu? --      Excl. in [place]? --    Constitutional: Alert and oriented. Well appearing and in no distress. Eyes: Normal exam ENT      Head: Normocephalic and atraumatic.      Mouth/Throat: Mucous membranes are moist. Cardiovascular: Normal rate, regular rhythm. No murmur Respiratory: Normal respiratory effort without tachypnea nor retractions. Breath sounds are clear  Gastrointestinal: Soft, mild epigastric tenderness palpation without rebound guarding or distention. Musculoskeletal: Nontender with normal range of motion in all extremities.  Neurologic:  Normal speech and language. No gross focal neurologic deficits Skin:  Skin is warm, dry and intact.  Psychiatric: Mood and affect are normal.   ____________________________________________    EKG  EKG viewed and interpreted by myself shows a normal sinus rhythm at 93 bpm with a narrow QRS, normal axis, normal intervals, nonspecific ST changes without ST elevation.  ____________________________________________   INITIAL IMPRESSION / ASSESSMENT AND PLAN / ED COURSE  Pertinent labs & imaging results that were available during my care of the patient were reviewed by me and considered in my medical decision making (see chart for details).   Patient presents to the emergency department for near syncopal episode at work.  Differential  would include dehydration, hypovolemia, vagal event, arrhythmia.  Overall the patient appears very well, does complain of very mild upper abdominal discomfort from being hit with a box in the abdomen yesterday.  Minimal tenderness to palpation.  Patient's lab work is largely nonrevealing.  We will add on LFTs and lipase as well as a troponin as a precaution.  EKG is reassuring.  Patient's labs are reassuring.  We will discharge with PCP follow-up and supportive care at home.  Colton Shelton was evaluated in Emergency Department on 08/21/2018 for the symptoms described in the history of present illness. He was evaluated in the context of the global COVID-19 pandemic, which necessitated consideration that the patient might be at risk for infection with the SARS-CoV-2 virus that causes COVID-19. Institutional protocols and algorithms that pertain to the evaluation of patients at risk for COVID-19 are in a state of rapid change based on information released by regulatory bodies including the CDC and federal and state organizations. These policies and algorithms were followed during the patient's care in the ED.  ____________________________________________   FINAL CLINICAL IMPRESSION(S) / ED DIAGNOSES  Near syncope   Minna AntisPaduchowski, Sayre Witherington, MD 08/21/18 [phone]

## 2018-11-04 ENCOUNTER — Other Ambulatory Visit: Payer: Self-pay

## 2018-11-04 ENCOUNTER — Emergency Department
Admission: EM | Admit: 2018-11-04 | Discharge: 2018-11-04 | Disposition: A | Discharge status: Home / Self Care | Payer: BLUE CROSS/BLUE SHIELD | Attending: Emergency Medicine | Admitting: Emergency Medicine

## 2018-11-04 ENCOUNTER — Emergency Department: Payer: BLUE CROSS/BLUE SHIELD

## 2018-11-04 ENCOUNTER — Encounter: Payer: Self-pay | Admitting: Emergency Medicine

## 2018-11-04 DIAGNOSIS — R42 Dizziness and giddiness: Secondary | ICD-10-CM

## 2018-11-04 DIAGNOSIS — R0789 Other chest pain: Secondary | ICD-10-CM | POA: Insufficient documentation

## 2018-11-04 LAB — CBC
HCT: 43.6 % (ref 39.0–52.0)
Hemoglobin: 14.9 g/dL (ref 13.0–17.0)
MCH: 28.7 pg (ref 26.0–34.0)
MCHC: 34.2 g/dL (ref 30.0–36.0)
MCV: 83.8 fL (ref 80.0–100.0)
Platelets: 255 10*3/uL (ref 150–400)
RBC: 5.2 MIL/uL (ref 4.22–5.81)
RDW: 12.7 % (ref 11.5–15.5)
WBC: 9.1 10*3/uL (ref 4.0–10.5)
nRBC: 0 % (ref 0.0–0.2)

## 2018-11-04 LAB — COMPREHENSIVE METABOLIC PANEL
ALT: 29 U/L (ref 0–44)
AST: 23 U/L (ref 15–41)
Albumin: 4.7 g/dL (ref 3.5–5.0)
Alkaline Phosphatase: 59 U/L (ref 38–126)
Anion gap: 10 (ref 5–15)
BUN: 14 mg/dL (ref 6–20)
CO2: 26 mmol/L (ref 22–32)
Calcium: 9.6 mg/dL (ref 8.9–10.3)
Chloride: 103 mmol/L (ref 98–111)
Creatinine, Ser: 0.86 mg/dL (ref 0.61–1.24)
GFR calc Af Amer: >60 mL/min (ref >60)
GFR calc non Af Amer: >60 mL/min (ref >60)
Glucose, Bld: 118 mg/dL — ABNORMAL HIGH (ref 70–99)
Potassium: 3.9 mmol/L (ref 3.5–5.1)
Sodium: 139 mmol/L (ref 135–145)
Total Bilirubin: 0.9 mg/dL (ref 0.3–1.2)
Total Protein: 7.9 g/dL (ref 6.5–8.1)

## 2018-11-04 LAB — TROPONIN I (HIGH SENSITIVITY): Troponin I (High Sensitivity): 6 ng/L (ref <18)

## 2018-11-04 NOTE — ED Provider Notes (Signed)
[facility] Emergency Department Provider Note   ____________________________________________    I have reviewed the triage vital signs and the nursing notes.   HISTORY  Chief Complaint Dizziness and chest discomfort    HPI Colton Shelton is a 28 y.o. male who presents with complaints of intermittent dizziness over the last several weeks.  Today he became concerned because he felt a twinge in his axilla bilaterally.  No chest pain at this time.  No shortness of breath.  He did have coronavirus several months ago and recovered after about 2 weeks.  No pleurisy.  No nausea or vomiting.  Has felt chills intermittently as well.  No abdominal pain   History reviewed. No pertinent past medical history.  There are no active problems to display for this patient.   History reviewed. No pertinent surgical history.  Prior to Admission medications   Medication Sig Start Date End Date Taking? Authorizing Provider  famotidine (PEPCID) 20 MG tablet Take 1 tablet (20 mg total) by mouth 2 (two) times daily. 04/23/17 05/23/17  Arta Silence, MD  ibuprofen (ADVIL,MOTRIN) 600 MG tablet Take 1 tablet (600 mg total) by mouth every 6 (six) hours as needed (Pain). 04/23/17   Arta Silence, MD  sucralfate (CARAFATE) 1 g tablet Take 1 tablet (1 g total) by mouth 4 (four) times daily. 09/15/16   Nance Pear, MD     Allergies Patient has no known allergies.  No family history on file.  Social History Social History   Tobacco Use  . Smoking status: Never Smoker  . Smokeless tobacco: Never Used  Substance Use Topics  . Alcohol use: No    Frequency: Never  . Drug use: Never    Review of Systems  Constitutional: No fever Eyes: No visual changes.  ENT: No sore throat. Cardiovascular: As above Respiratory: As above Gastrointestinal: No abdominal pain.  No nausea, no vomiting.   Genitourinary: Negative for dysuria. Musculoskeletal: Has  developed tingling in his legs intermittently over the last several weeks Skin: Negative for rash. Neurological: Negative for headaches or weakness   ____________________________________________   PHYSICAL EXAM:  VITAL SIGNS: ED Triage Vitals  Enc Vitals Group     BP 11/04/18 0818 (!) 145/93     Pulse Rate 11/04/18 0818 84     Resp 11/04/18 0818 16     Temp 11/04/18 0818 98.7 F (37.1 C)     Temp Source 11/04/18 0818 Oral     SpO2 11/04/18 0818 97 %     Weight 11/04/18 0817 94.3 kg (208 lb)     Height 11/04/18 0817 1.676 m (5\' 6" )     Head Circumference --      Peak Flow --      Pain Score 11/04/18 0816 0     Pain Loc --      Pain Edu? --      Excl. in [place]? --     Constitutional: Alert and oriented.  Eyes: Conjunctivae are normal.  PERRLA, EOMI  Nose: No congestion/rhinnorhea. Mouth/Throat: Mucous membranes are moist.    Cardiovascular: Normal rate, regular rhythm. Grossly normal heart sounds.  Good peripheral circulation. Respiratory: Normal respiratory effort.  No retractions. Lungs CTAB. Gastrointestinal: Soft and nontender. No distention.  No CVA tenderness.  Musculoskeletal: No lower extremity tenderness nor edema.  Warm and well perfused Neurologic:  Normal speech and language. No gross focal neurologic deficits are appreciated.  Skin:  Skin is warm, dry and intact. No rash  noted. Psychiatric: Mood and affect are normal. Speech and behavior are normal.  ____________________________________________   LABS (all labs ordered are listed, but only abnormal results are displayed)  Labs Reviewed  COMPREHENSIVE METABOLIC PANEL - Abnormal; Notable for the following components:      Result Value   Glucose, Bld 118 (*)    All other components within normal limits  CBC  TROPONIN I (HIGH SENSITIVITY)   ____________________________________________  EKG  ED ECG REPORT I, Jene Every, the attending physician, personally viewed and interpreted this ECG.  Date:  11/04/2018  Rhythm: normal sinus rhythm QRS Axis: normal Intervals: normal ST/T Wave abnormalities: normal Narrative Interpretation: no evidence of acute ischemia  ____________________________________________  RADIOLOGY  Chest x-ray unremarkable ____________________________________________   PROCEDURES  Procedure(s) performed: No  Procedures   Critical Care performed: No ____________________________________________   INITIAL IMPRESSION / ASSESSMENT AND PLAN / ED COURSE  Pertinent labs & imaging results that were available during my care of the patient were reviewed by me and considered in my medical decision making (see chart for details).  Patient presents with complaints of lightheadedness which is intermittent over the last 2 to 3 weeks.  No reports of palpitations no significant chest pain.  Not consistent with ACS.  Possibility of coronavirus complications.  Not consistent with PE.  Will check electrolytes, chest x-ray and reevaluate.  Lab work is quite reassuring, patient remains well-appearing in no acute distress.  Discussed with him that he needs close follow-up with PCP for further evaluation    ____________________________________________   FINAL CLINICAL IMPRESSION(S) / ED DIAGNOSES  Final diagnoses:  Dizziness  Atypical chest pain        Note:  This document was prepared using Dragon voice recognition software and may include unintentional dictation errors.   Jene Every, MD 11/04/18 1106

## 2018-11-04 NOTE — ED Triage Notes (Signed)
Pt reports dizzy and pain in his legs for the past 2 weeks.

## 2018-11-28 ENCOUNTER — Emergency Department: Payer: BLUE CROSS/BLUE SHIELD

## 2018-11-28 ENCOUNTER — Encounter: Payer: Self-pay | Admitting: Emergency Medicine

## 2018-11-28 ENCOUNTER — Emergency Department
Admission: EM | Admit: 2018-11-28 | Discharge: 2018-11-28 | Disposition: A | Discharge status: Home / Self Care | Payer: BLUE CROSS/BLUE SHIELD | Attending: Emergency Medicine | Admitting: Emergency Medicine

## 2018-11-28 ENCOUNTER — Other Ambulatory Visit: Payer: Self-pay

## 2018-11-28 DIAGNOSIS — R0602 Shortness of breath: Secondary | ICD-10-CM | POA: Diagnosis not present

## 2018-11-28 DIAGNOSIS — Z20828 Contact with and (suspected) exposure to other viral communicable diseases: Secondary | ICD-10-CM | POA: Diagnosis not present

## 2018-11-28 DIAGNOSIS — R42 Dizziness and giddiness: Secondary | ICD-10-CM | POA: Insufficient documentation

## 2018-11-28 DIAGNOSIS — R0789 Other chest pain: Secondary | ICD-10-CM | POA: Diagnosis not present

## 2018-11-28 LAB — CBC WITH DIFFERENTIAL/PLATELET
Abs Immature Granulocytes: 0.02 10*3/uL (ref 0.00–0.07)
Basophils Absolute: 0.1 10*3/uL (ref 0.0–0.1)
Basophils Relative: 1 %
Eosinophils Absolute: 1.3 10*3/uL — ABNORMAL HIGH (ref 0.0–0.5)
Eosinophils Relative: 13 %
HCT: 45.6 % (ref 39.0–52.0)
Hemoglobin: 15.7 g/dL (ref 13.0–17.0)
Immature Granulocytes: 0 %
Lymphocytes Relative: 35 %
Lymphs Abs: 3.4 10*3/uL (ref 0.7–4.0)
MCH: 28.8 pg (ref 26.0–34.0)
MCHC: 34.4 g/dL (ref 30.0–36.0)
MCV: 83.7 fL (ref 80.0–100.0)
Monocytes Absolute: 0.6 10*3/uL (ref 0.1–1.0)
Monocytes Relative: 6 %
Neutro Abs: 4.3 10*3/uL (ref 1.7–7.7)
Neutrophils Relative %: 45 %
Platelets: 265 10*3/uL (ref 150–400)
RBC: 5.45 MIL/uL (ref 4.22–5.81)
RDW: 12.4 % (ref 11.5–15.5)
WBC: 9.6 10*3/uL (ref 4.0–10.5)
nRBC: 0 % (ref 0.0–0.2)

## 2018-11-28 LAB — TROPONIN I (HIGH SENSITIVITY): Troponin I (High Sensitivity): 3 ng/L (ref <18)

## 2018-11-28 LAB — COMPREHENSIVE METABOLIC PANEL
ALT: 27 U/L (ref 0–44)
AST: 22 U/L (ref 15–41)
Albumin: 4.5 g/dL (ref 3.5–5.0)
Alkaline Phosphatase: 61 U/L (ref 38–126)
Anion gap: 9 (ref 5–15)
BUN: 18 mg/dL (ref 6–20)
CO2: 26 mmol/L (ref 22–32)
Calcium: 9.5 mg/dL (ref 8.9–10.3)
Chloride: 104 mmol/L (ref 98–111)
Creatinine, Ser: 0.77 mg/dL (ref 0.61–1.24)
GFR calc Af Amer: >60 mL/min (ref >60)
GFR calc non Af Amer: >60 mL/min (ref >60)
Glucose, Bld: 120 mg/dL — ABNORMAL HIGH (ref 70–99)
Potassium: 3.9 mmol/L (ref 3.5–5.1)
Sodium: 139 mmol/L (ref 135–145)
Total Bilirubin: 0.7 mg/dL (ref 0.3–1.2)
Total Protein: 7.9 g/dL (ref 6.5–8.1)

## 2018-11-28 LAB — SARS CORONAVIRUS 2 (TAT 6-24 HRS): SARS Coronavirus 2: NEGATIVE

## 2018-11-28 IMAGING — CT CT HEAD W/O CM
3 series · 15 of 47 positions shown, 18 images · non-contrast
Comparison: None.

CLINICAL DATA: Ataxia, near syncopal episodes with posterior
headaches and shortness-of-breath as well as fatigue 2 days.
Possible stroke.

EXAM:
CT HEAD WITHOUT CONTRAST
TECHNIQUE: Contiguous axial images were obtained from the base of the skull
through the vertex without intravenous contrast.

[Series 2: head wo · axial · 0.42mm/px · z∈[-157,-27]mm · 9 of 32 slices shown, 12 images]
[im 3/32  brain]
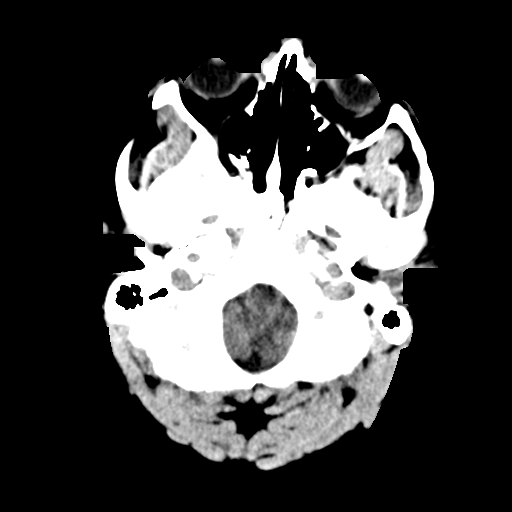
[im 3/32  bone]
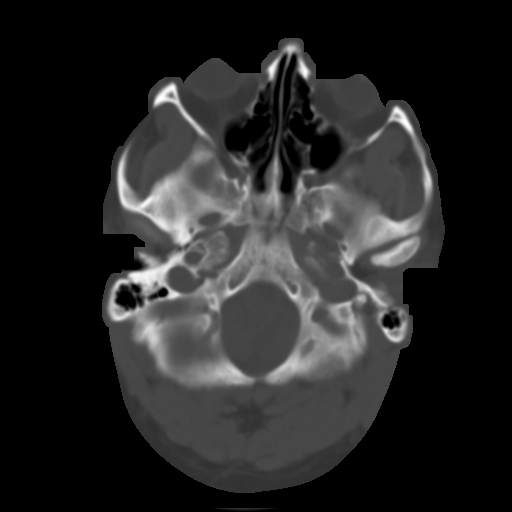
[im 6/32  brain]
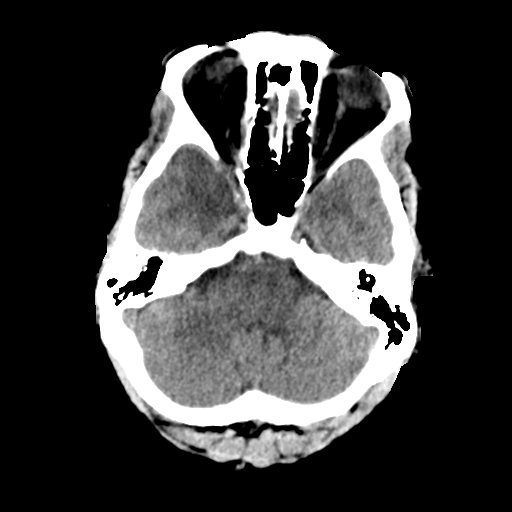
[im 9/32  brain]
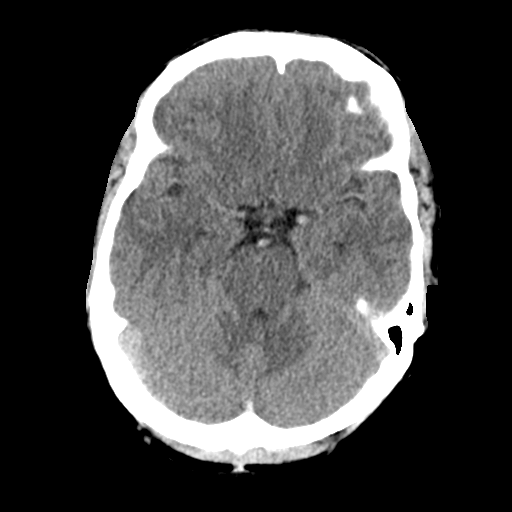
[im 12/32  brain]
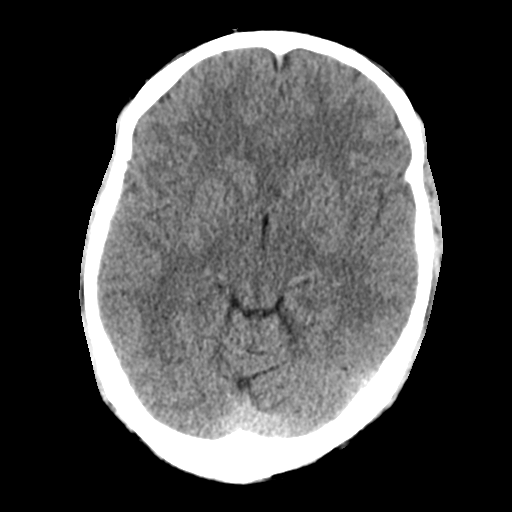
[im 17/32  brain]
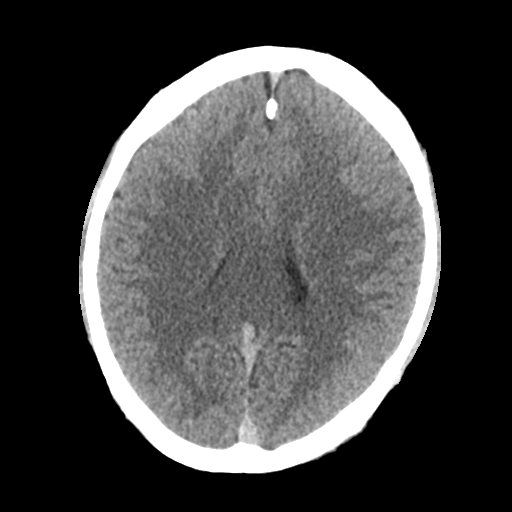
[im 17/32  bone]
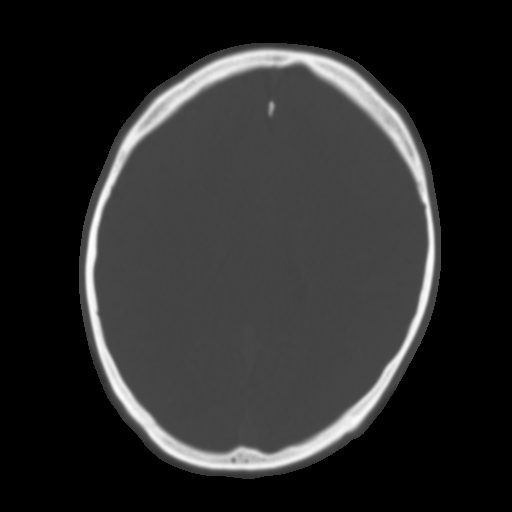
[im 20/32  brain]
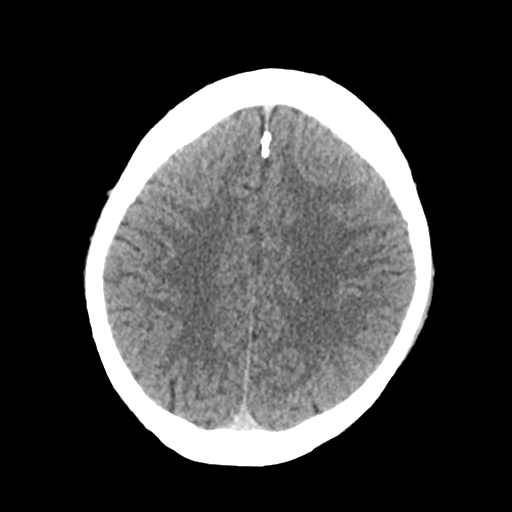
[im 23/32  brain]
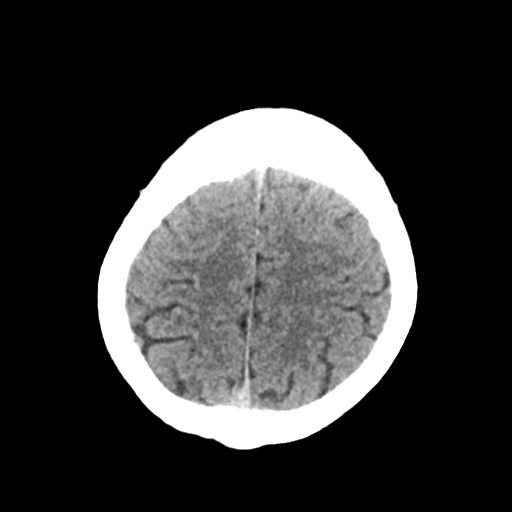
[im 26/32  brain]
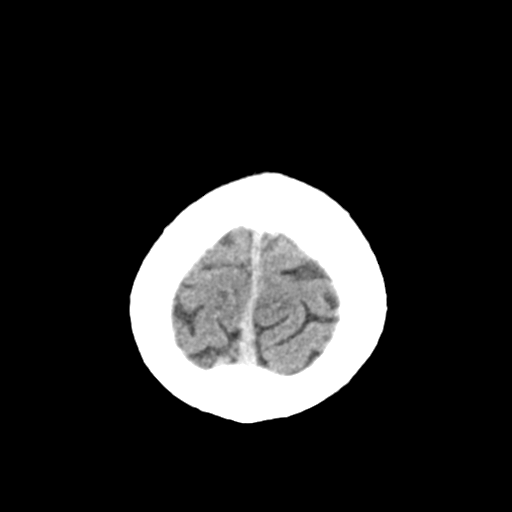
[im 29/32  brain]
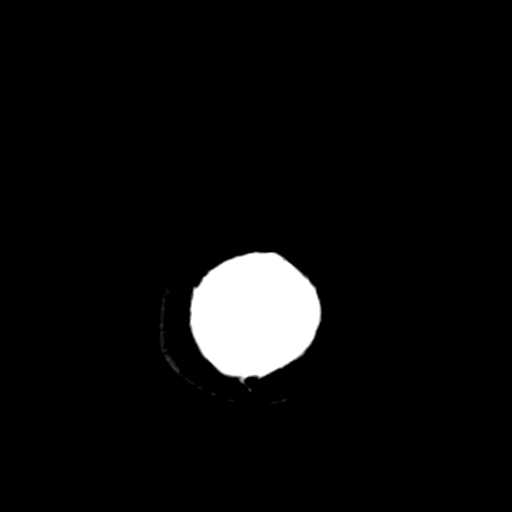
[im 29/32  bone]
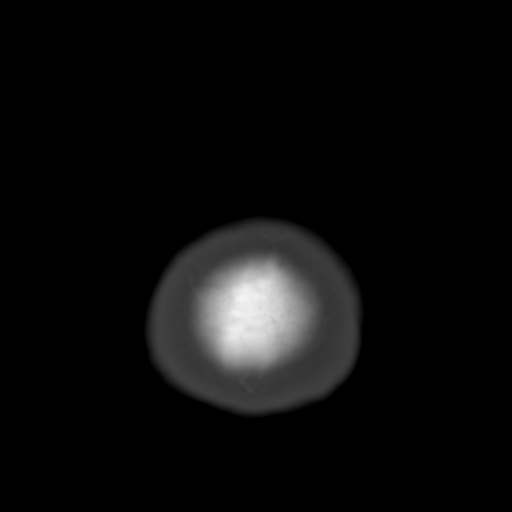

[Series 4: coronal soft tissue · coronal · 0.31mm/px · 3 of 69 slices shown]
[im 23/69  brain]
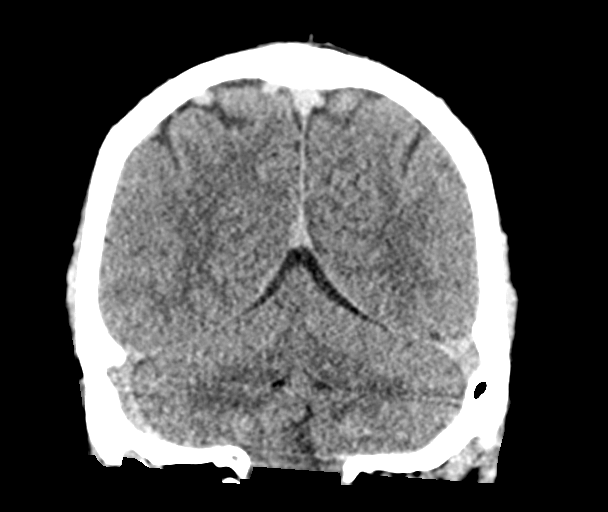
[im 31/69  brain]
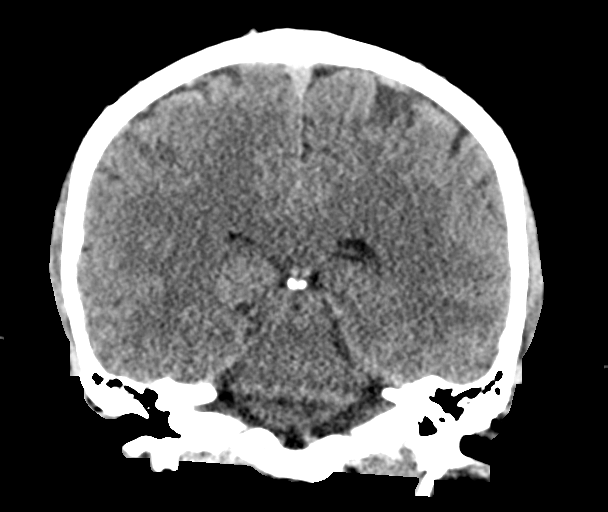
[im 38/69  brain]
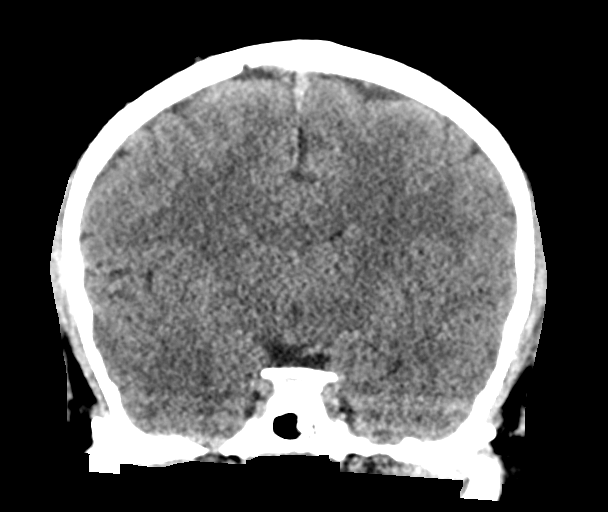

[Series 5: sagittal soft tissue · sagittal · 0.31mm/px · 3 of 56 slices shown]
[im 19/56  brain]
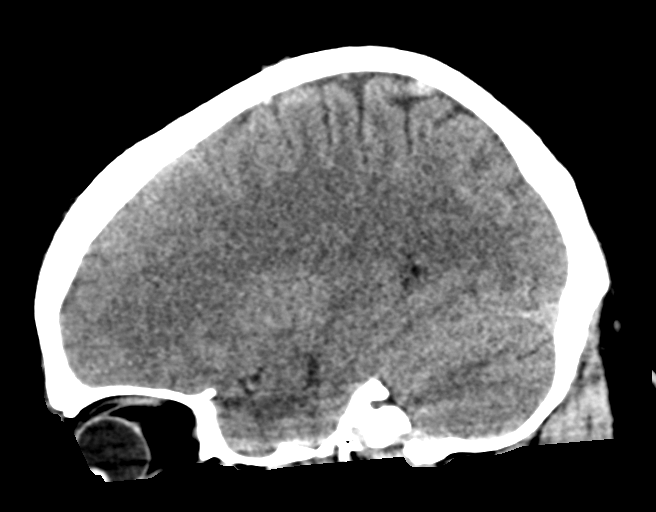
[im 28/56  brain]
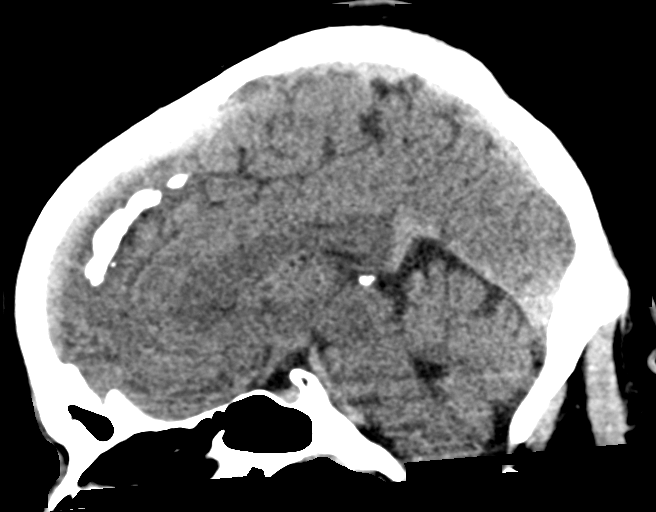
[im 37/56  brain]
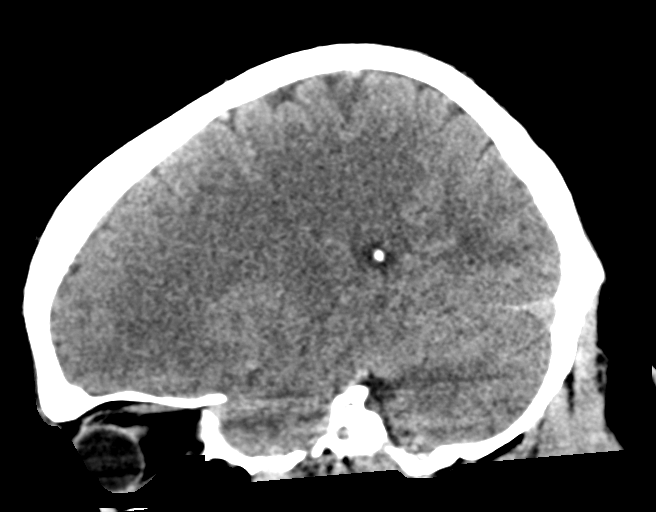

[15 of 47 positions shown; findings below may reference images not displayed]

FINDINGS: Brain: Ventricles, cisterns and other CSF spaces are normal. There
is no mass, mass effect, shift of midline structures or acute
hemorrhage. No evidence of acute infarction.

Vascular: No hyperdense vessel or unexpected calcification.

Skull: Normal. Negative for fracture or focal lesion.

Sinuses/Orbits: No acute finding.

Other: None.
IMPRESSION: No acute findings.

## 2018-11-28 MED ORDER — IOHEXOL 350 MG/ML SOLN
75.0000 mL | Freq: Once | INTRAVENOUS | Status: AC | PRN
  Administered 2018-11-28: 75 mL via INTRAVENOUS

## 2018-11-28 NOTE — ED Provider Notes (Signed)
[facility] Emergency Department Provider Note       Time seen: ----------------------------------------- 7:51 AM on 11/28/2018 -----------------------------------------   I have reviewed the triage vital signs and the nursing notes.  HISTORY   Chief Complaint Shortness of Breath and Chest Pain   HPI Violet Cart is a 28 y.o. male with no significant past medical history who presents to the ED for shortness of breath for the last 2 days with nonproductive cough.  Symptoms are worse with exertion.  He complains of pressure to the upper chest radiating to the shoulder.  Discomfort is 8 out of 10.  Patient describes worsening dizziness over the last year or so, he feels like he is going to pass out.  He also describes some neurologic complaints like decree sensation in his lower extremities, some vision changes.  History reviewed. No pertinent past medical history.  There are no active problems to display for this patient.   History reviewed. No pertinent surgical history.  Allergies Patient has no known allergies.  Social History Social History   Tobacco Use  . Smoking status: Never Smoker  . Smokeless tobacco: Never Used  Substance Use Topics  . Alcohol use: No    Frequency: Never  . Drug use: Never   Review of Systems Constitutional: Negative for fever. Cardiovascular: Positive for chest pain Respiratory: Positive for shortness of breath and cough Gastrointestinal: Negative for abdominal pain, vomiting and diarrhea. Musculoskeletal: Negative for back pain. Skin: Negative for rash. Neurological: Positive for numbness, negative for headaches  All systems negative/normal/unremarkable except as stated in the HPI  ____________________________________________   PHYSICAL EXAM:  VITAL SIGNS: ED Triage Vitals [11/28/18 0547]  Enc Vitals Group     BP      Pulse      Resp      Temp      Temp src      SpO2      Weight 207  lb 14.3 oz (94.3 kg)     Height _0  (1.676 m)     Head Circumference      Peak Flow      Pain Score 8     Pain Loc      Pain Edu?      Excl. in [place]?     Constitutional: Alert and oriented. Well appearing and in no distress. Eyes: Conjunctivae are normal. Normal extraocular movements. ENT      Head: Normocephalic and atraumatic.      Nose: No congestion/rhinnorhea.      Mouth/Throat: Mucous membranes are moist.      Neck: No stridor. Cardiovascular: Normal rate, regular rhythm. No murmurs, rubs, or gallops. Respiratory: Normal respiratory effort without tachypnea nor retractions. Breath sounds are clear and equal bilaterally. No wheezes/rales/rhonchi. Gastrointestinal: Soft and nontender. Normal bowel sounds Musculoskeletal: Nontender with normal range of motion in extremities. No lower extremity tenderness nor edema. Neurologic:  Normal speech and language. No gross focal neurologic deficits are appreciated.  Skin:  Skin is warm, dry and intact. No rash noted. Psychiatric: Mood and affect are normal. Speech and behavior are normal.  ____________________________________________  EKG: Interpreted by me.  Sinus rhythm with rate of 81 bpm, rightward axis, early repolarization, normal QT  ____________________________________________  ED COURSE:  As part of my medical decision making, I reviewed the following data within the [place] History obtained from family if available, nursing notes, old chart and ekg, as well as notes from prior ED visits.  Patient presented for shortness of breath and chest pain, we will assess with labs and imaging as indicated at this time.   Procedures  Mariel Gaudin was evaluated in Emergency Department on 11/28/2018 for the symptoms described in the history of present illness. He was evaluated in the context of the global COVID-19 pandemic, which necessitated consideration that the patient might be at risk for infection  with the SARS-CoV-2 virus that causes COVID-19. Institutional protocols and algorithms that pertain to the evaluation of patients at risk for COVID-19 are in a state of rapid change based on information released by regulatory bodies including the CDC and federal and state organizations. These policies and algorithms were followed during the patient's care in the ED.  ____________________________________________   LABS (pertinent positives/negatives)  Labs Reviewed  CBC WITH DIFFERENTIAL/PLATELET - Abnormal; Notable for the following components:      Result Value   Eosinophils Absolute 1.3 (*)    All other components within normal limits  COMPREHENSIVE METABOLIC PANEL - Abnormal; Notable for the following components:   Glucose, Bld 120 (*)    All other components within normal limits  SARS CORONAVIRUS 2 (TAT 6-24 HRS)  TROPONIN I (HIGH SENSITIVITY)    RADIOLOGY  Chest x-ray was normal CT head, CT angiogram chest Were unremarkable ____________________________________________   DIFFERENTIAL DIAGNOSIS   Musculoskeletal pain, GERD, anxiety, MI, PE  FINAL ASSESSMENT AND PLAN  Dyspnea, dizziness   Plan: The patient had presented for shortness of breath with nonproductive cough and chest pressure. Patient's labs are unremarkable. Patient's imaging was normal.  Patient describes a myriad of symptoms which are difficult to delineate.  I will order additional test today but it is unclear what is causing this.  I think he would benefit from echocardiogram.  He will be referred to cardiology for same.   Laurence Aly, MD    Note: This note was generated in part or whole with voice recognition software. Voice recognition is usually quite accurate but there are transcription errors that can and very often do occur. I apologize for any typographical errors that were not detected and corrected.     Earleen Newport, MD 11/28/18 [phone]

## 2018-11-28 NOTE — ED Notes (Signed)
Patient transported to CT 

## 2018-11-28 NOTE — ED Triage Notes (Signed)
Patient ambulatory to triage with steady gait, without difficulty or distress noted, mask in place; pt reports SHOB x 2 days with nonprod cough, esp with exertion; also c/o pressure to upper chest radiating to shoulder

## 2018-12-05 ENCOUNTER — Ambulatory Visit: Payer: BLUE CROSS/BLUE SHIELD | Admitting: Internal Medicine

## 2018-12-05 NOTE — Progress Notes (Deleted)
   New Outpatient Visit Date: 12/05/2018  Referring Provider: Earleen Newport, MD [address] [place],  [place] [id number]  Chief Complaint: ***  HPI:  Mr. Colton Shelton is a 28 y.o. male who is being seen today for the evaluation of chest pain and dizziness at the request of Dr. Jimmye Norman. He has no significant past medical history.  He was seen in the [facility] emergency department on 11/28/2018, complaining of a 2-day history of nonproductive cough and shortness of breath as well as upper chest pressure radiating to the shoulders.  He also reported lightheadedness and decreased sensation in his legs as well as vision changes.  ED work-up was unrevealing.  A clear etiology for his complaints was not uncovered and the patient was referred to cardiology for further evaluation of his "myriad of symptoms."  --------------------------------------------------------------------------------------------------  Cardiovascular History & Procedures: Cardiovascular Problems:  Chest pain  Shortness of breath  Dizziness  Risk Factors:  None  Cath/PCI:  None  CV Surgery:  None  EP Procedures and Devices:  None  Non-Invasive Evaluation(s):  None  Recent CV Pertinent Labs: Lab Results  Component Value Date   K 3.9 11/28/2018   BUN 18 11/28/2018   CREATININE 0.77 11/28/2018    --------------------------------------------------------------------------------------------------  No past medical history on file.  No past surgical history on file.  No outpatient medications have been marked as taking for the 12/05/18 encounter (Appointment) with Abigaelle Verley, Harrell Gave, MD.    Allergies: Patient has no known allergies.  Social History   Tobacco Use  . Smoking status: Never Smoker  . Smokeless tobacco: Never Used  Substance Use Topics  . Alcohol use: No    Frequency: Never  . Drug use: Never    No family history on file.  Review of Systems: A 12-system review of systems  was performed and was negative except as noted in the HPI.  --------------------------------------------------------------------------------------------------  Physical Exam: There were no vitals taken for this visit.  General:  *** HEENT: No conjunctival pallor or scleral icterus. Moist mucous membranes. OP clear. Neck: Supple without lymphadenopathy, thyromegaly, JVD, or HJR. No carotid bruit. Lungs: Normal work of breathing. Clear to auscultation bilaterally without wheezes or crackles. Heart: Regular rate and rhythm without murmurs, rubs, or gallops. Non-displaced PMI. Abd: Bowel sounds present. Soft, NT/ND without hepatosplenomegaly Ext: No lower extremity edema. Radial, PT, and DP pulses are 2+ bilaterally Skin: Warm and dry without rash. Neuro: CNIII-XII intact. Strength and fine-touch sensation intact in upper and lower extremities bilaterally. Psych: Normal mood and affect.  EKG:  ***  Lab Results  Component Value Date   WBC 9.6 11/28/2018   HGB 15.7 11/28/2018   HCT 45.6 11/28/2018   MCV 83.7 11/28/2018   PLT 265 11/28/2018    Lab Results  Component Value Date   NA 139 11/28/2018   K 3.9 11/28/2018   CL 104 11/28/2018   CO2 26 11/28/2018   BUN 18 11/28/2018   CREATININE 0.77 11/28/2018   GLUCOSE 120 (H) 11/28/2018   ALT 27 11/28/2018    No results found for: CHOL, HDL, LDLCALC, LDLDIRECT, TRIG, CHOLHDL   --------------------------------------------------------------------------------------------------  ASSESSMENT AND PLAN: ***  Nelva Bush, MD 12/05/2018 7:07 AM

## 2021-01-10 ENCOUNTER — Other Ambulatory Visit: Payer: Self-pay

## 2021-01-10 ENCOUNTER — Encounter: Payer: Self-pay | Admitting: *Deleted

## 2021-01-10 ENCOUNTER — Emergency Department
Admission: EM | Admit: 2021-01-10 | Discharge: 2021-01-10 | Disposition: A | Discharge status: Home / Self Care | Payer: BLUE CROSS/BLUE SHIELD | Attending: Emergency Medicine | Admitting: Emergency Medicine

## 2021-01-10 DIAGNOSIS — R1110 Vomiting, unspecified: Secondary | ICD-10-CM | POA: Insufficient documentation

## 2021-01-10 DIAGNOSIS — Z20822 Contact with and (suspected) exposure to covid-19: Secondary | ICD-10-CM | POA: Insufficient documentation

## 2021-01-10 DIAGNOSIS — K0889 Other specified disorders of teeth and supporting structures: Secondary | ICD-10-CM | POA: Insufficient documentation

## 2021-01-10 DIAGNOSIS — Z5321 Procedure and treatment not carried out due to patient leaving prior to being seen by health care provider: Secondary | ICD-10-CM | POA: Insufficient documentation

## 2021-01-10 LAB — URINALYSIS, ROUTINE W REFLEX MICROSCOPIC
Bilirubin Urine: NEGATIVE
Glucose, UA: NEGATIVE mg/dL
Hgb urine dipstick: NEGATIVE
Ketones, ur: NEGATIVE mg/dL
Leukocytes,Ua: NEGATIVE
Nitrite: NEGATIVE
Protein, ur: NEGATIVE mg/dL
Specific Gravity, Urine: 1.013 (ref 1.005–1.030)
pH: 6 (ref 5.0–8.0)

## 2021-01-10 LAB — CBC WITH DIFFERENTIAL/PLATELET
Abs Immature Granulocytes: 0.04 10*3/uL (ref 0.00–0.07)
Basophils Absolute: 0.1 10*3/uL (ref 0.0–0.1)
Basophils Relative: 1 %
Eosinophils Absolute: 0.1 10*3/uL (ref 0.0–0.5)
Eosinophils Relative: 1 %
HCT: 43.3 % (ref 39.0–52.0)
Hemoglobin: 15 g/dL (ref 13.0–17.0)
Immature Granulocytes: 0 %
Lymphocytes Relative: 36 %
Lymphs Abs: 3.6 10*3/uL (ref 0.7–4.0)
MCH: 29.2 pg (ref 26.0–34.0)
MCHC: 34.6 g/dL (ref 30.0–36.0)
MCV: 84.4 fL (ref 80.0–100.0)
Monocytes Absolute: 0.6 10*3/uL (ref 0.1–1.0)
Monocytes Relative: 6 %
Neutro Abs: 5.8 10*3/uL (ref 1.7–7.7)
Neutrophils Relative %: 56 %
Platelets: 305 10*3/uL (ref 150–400)
RBC: 5.13 MIL/uL (ref 4.22–5.81)
RDW: 12.5 % (ref 11.5–15.5)
WBC: 10.3 10*3/uL (ref 4.0–10.5)
nRBC: 0 % (ref 0.0–0.2)

## 2021-01-10 LAB — COMPREHENSIVE METABOLIC PANEL
ALT: 25 U/L (ref 0–44)
AST: 19 U/L (ref 15–41)
Albumin: 4.5 g/dL (ref 3.5–5.0)
Alkaline Phosphatase: 65 U/L (ref 38–126)
Anion gap: 7 (ref 5–15)
BUN: 17 mg/dL (ref 6–20)
CO2: 26 mmol/L (ref 22–32)
Calcium: 9.3 mg/dL (ref 8.9–10.3)
Chloride: 103 mmol/L (ref 98–111)
Creatinine, Ser: 0.83 mg/dL (ref 0.61–1.24)
GFR, Estimated: >60 mL/min (ref >60)
Glucose, Bld: 116 mg/dL — ABNORMAL HIGH (ref 70–99)
Potassium: 3.5 mmol/L (ref 3.5–5.1)
Sodium: 136 mmol/L (ref 135–145)
Total Bilirubin: 0.7 mg/dL (ref 0.3–1.2)
Total Protein: 8.3 g/dL — ABNORMAL HIGH (ref 6.5–8.1)

## 2021-01-10 LAB — RESP PANEL BY RT-PCR (FLU A&B, COVID) ARPGX2
Influenza A by PCR: NEGATIVE
Influenza B by PCR: NEGATIVE
SARS Coronavirus 2 by RT PCR: NEGATIVE

## 2021-01-10 LAB — LIPASE, BLOOD: Lipase: 46 U/L (ref 11–51)

## 2021-01-10 NOTE — ED Triage Notes (Signed)
Pt has left upper toothache and also has vomiting tonight.  Pt taking pepto bismol with relief.  No abd pain now.  No diarrhea.  Pt alert  speech clear.

## 2021-01-10 NOTE — ED Provider Notes (Signed)
HPI: Pt is a 30 y.o. male who presents with complaints of vomiting.  The patient p/w   vomtiing now resolved with pepto bismal.  No abd pain. Reports some left tooth pain as well.   ROS: Denies fever, chest pain, vomiting  No past medical history on file. There were no vitals filed for this visit.  Focused Physical Exam: Gen: No acute distress Head: atraumatic, normocephalic Eyes: Extraocular movements grossly intact; conjunctiva clear CV: RRR Lung: No increased WOB, no stridor GI: ND, no obvious masses Neuro: Alert and awake  Medical Decision Making and Plan: Given the patient's initial medical screening exam, the following diagnostic evaluation has been ordered. The patient will be placed in the appropriate treatment space, once one is available, to complete the evaluation and treatment. I have discussed the plan of care with the patient and I have advised the patient that an ED physician or mid-level practitioner will reevaluate their condition after the test results have been received, as the results may give them additional insight into the type of treatment they may need.   Diagnostics: labs   Treatments: none immediately   Concha Se, MD 01/10/21 [phone]

## 2021-07-14 ENCOUNTER — Emergency Department
Admission: EM | Admit: 2021-07-14 | Discharge: 2021-07-14 | Disposition: A | Discharge status: Home / Self Care | Payer: Self-pay | Attending: Emergency Medicine | Admitting: Emergency Medicine

## 2021-07-14 ENCOUNTER — Encounter: Payer: Self-pay | Admitting: Emergency Medicine

## 2021-07-14 DIAGNOSIS — H1033 Unspecified acute conjunctivitis, bilateral: Secondary | ICD-10-CM | POA: Insufficient documentation

## 2021-07-14 MED ORDER — TETRACAINE HCL 0.5 % OP SOLN
2.0000 [drp] | Freq: Once | OPHTHALMIC | Status: AC
  Administered 2021-07-14: 2 [drp] via OPHTHALMIC
  Filled 2021-07-14: qty 4

## 2021-07-14 MED ORDER — POLYMYXIN B-TRIMETHOPRIM 10000-0.1 UNIT/ML-% OP SOLN: 1.0000 [drp] | Freq: Four times a day (QID) | OPHTHALMIC | 0 refills | Status: AC

## 2021-07-14 MED ORDER — FLUORESCEIN SODIUM 1 MG OP STRP
1.0000 | ORAL_STRIP | Freq: Once | OPHTHALMIC | Status: AC
  Administered 2021-07-14: 1 via OPHTHALMIC
  Filled 2021-07-14: qty 1

## 2021-07-14 NOTE — Discharge Instructions (Signed)
You may alternate Tylenol 1000 mg every 6 hours as needed for pain, fever and Ibuprofen 800 mg every 6-8 hours as needed for pain, fever.  Please take Ibuprofen with food.  Do not take more than 4000 mg of Tylenol (acetaminophen) in a 24 hour period. ° °

## 2021-07-14 NOTE — ED Provider Notes (Signed)
[facility] Provider Note    Event Date/Time   First MD Initiated Contact with Patient 07/14/21 0249     (approximate)   History   No chief complaint on file.   HPI  Colton Shelton is a 31 y.o. male with no significant past medical history who presents to the emergency department with bilateral eye redness, itching, burning and drainage that started 2 to 3 days ago.  States last week he had cough, congestion, body aches.  Had a negative COVID test.  States he was feeling better for several days and then started having pinkeye.  Does not wear glasses or contacts.  No blurry vision, vision loss.  No previous eye surgeries.  No injury to the face, eyes.  Has been putting over-the-counter Visine in his eyes without relief.   History provided by patient.    History reviewed. No pertinent past medical history.  History reviewed. No pertinent surgical history.  MEDICATIONS:  Prior to Admission medications   Medication Sig Start Date End Date Taking? Authorizing Provider  famotidine (PEPCID) 20 MG tablet Take 1 tablet (20 mg total) by mouth 2 (two) times daily. 04/23/17 05/23/17  Dionne Bucy, MD  ibuprofen (ADVIL,MOTRIN) 600 MG tablet Take 1 tablet (600 mg total) by mouth every 6 (six) hours as needed (Pain). 04/23/17   Dionne Bucy, MD  sucralfate (CARAFATE) 1 g tablet Take 1 tablet (1 g total) by mouth 4 (four) times daily. 09/15/16   Phineas Semen, MD    Physical Exam   Triage Vital Signs: ED Triage Vitals  Enc Vitals Group     BP 07/14/21 0205 (!) 156/112     Pulse Rate 07/14/21 0205 90     Resp 07/14/21 0205 20     Temp 07/14/21 0205 98.5 F (36.9 C)     Temp Source 07/14/21 0205 Oral     SpO2 07/14/21 0205 95 %     Weight 07/14/21 0205 210 lb (95.3 kg)     Height 07/14/21 0205 5\' 9"  (1.753 m)     Head Circumference --      Peak Flow --      Pain Score 07/14/21 0212 10     Pain Loc --      Pain Edu? --      Excl.  in [place]? --     Most recent vital signs: Vitals:   07/14/21 0205 07/14/21 0329  BP: (!) 156/112 (!) 146/99  Pulse: 90 91  Resp: 20 17  Temp: 98.5 F (36.9 C)   SpO2: 95% 98%     CONSTITUTIONAL: Alert and responds appropriately to questions. Well-appearing; well-nourished HEAD: Normocephalic, atraumatic EYES: Injected conjunctiva bilaterally.  No chemosis.  Pupils equal and reactive to light without photophobia or pain with consensual light response.  Extraocular movements intact.  Normal-appearing globes.  No hyphema or hypopyon.  Intraocular pressure of the right eye is 11 mmHg.  Intraocular pressure of the left eye is 12 mmHg.  Funduscopic exam limited due to patient's eyes not being dilated but I do not appreciate any abnormality.  Optic disc appear normal.  He does have some purulent drainage noted at the medial canthi bilaterally.  No fluorescein uptake. Visual Acuity R Near: 20/20 R Distance: 20/25 L Near: 20/20 L Distance: 20/20 ENT: normal nose; moist mucous membranes NECK: Normal range of motion CARD: Regular rate and rhythm RESP: Normal chest excursion without splinting or tachypnea; no hypoxia or respiratory distress, speaking full sentences ABD/GI:  non-distended EXT: Normal ROM in all joints, no major deformities noted SKIN: Normal color for age and race, no rashes on exposed skin NEURO: Moves all extremities equally, normal speech, no facial asymmetry noted PSYCH: The patient's mood and manner are appropriate. Grooming and personal hygiene are appropriate.  ED Results / Procedures / Treatments   LABS: (all labs ordered are listed, but only abnormal results are displayed) Labs Reviewed - No data to display   EKG:   RADIOLOGY: My personal review and interpretation of imaging:    I have personally reviewed all radiology reports. No results found.   PROCEDURES:  Critical Care performed: No      Procedures    IMPRESSION / MDM / ASSESSMENT AND PLAN /  ED COURSE  I reviewed the triage vital signs and the nursing notes.   Patient here for bilateral conjunctivitis.  Suspect that it likely started off viral but could now be bacterial.  Does have decent amount of purulent drainage but no systemic symptoms and is afebrile.  No vision changes.     DIFFERENTIAL DIAGNOSIS (includes but not limited to):   Bacterial conjunctivitis, viral conjunctivitis, allergic conjunctivitis, no signs of globe injury, corneal ulceration, corneal abrasion, glaucoma, iritis/uveitis, hyphema or hypopyon  Patient's presentation is most consistent with acute presentation with potential threat to life or bodily function.  PLAN: Patient here with bilateral conjunctivitis.  Eye exam is reassuring.  Normal bilateral intraocular pressures and normal visual acuity.  No injuries to suggest globe injury.  No fluorescein uptake to suggest abrasion, ulceration.  He does not wear glasses or contacts.  Will discharge with ophthalmology follow-up and Polytrim drops.  Will provide with work note.   MEDICATIONS GIVEN IN ED: Medications  fluorescein ophthalmic strip 1 strip (1 strip Both Eyes Given by Other 07/14/21 0312)  tetracaine (PONTOCAINE) 0.5 % ophthalmic solution 2 drop (2 drops Both Eyes Given by Other 07/14/21 2440)     ED COURSE:  At this time, I do not feel there is any life-threatening condition present. I reviewed all nursing notes, vitals, pertinent previous records.  All lab and urine results, EKGs, imaging ordered have been independently reviewed and interpreted by myself.  I reviewed all available radiology reports from any imaging ordered this visit.  Based on my assessment, I feel the patient is safe to be discharged home without further emergent workup and can continue workup as an outpatient as needed. Discussed all findings, treatment plan as well as usual and customary return precautions with patient.  They verbalize understanding and are comfortable with this  plan.  Outpatient follow-up has been provided as needed.  All questions have been answered.    CONSULTS: No emergent ophthalmology consult needed at this time.  No sign of any globe injury, vision changes, glaucoma.   OUTSIDE RECORDS REVIEWED: No previous records for review.     FINAL CLINICAL IMPRESSION(S) / ED DIAGNOSES   Final diagnoses:  Acute conjunctivitis of both eyes, unspecified acute conjunctivitis type     Rx / DC Orders   ED Discharge Orders          Ordered    trimethoprim-polymyxin b (POLYTRIM) ophthalmic solution  Every 6 hours        07/14/21 0319             Note:  This document was prepared using Dragon voice recognition software and may include unintentional dictation errors.   Anthony Tamburo, Layla Maw, DO 07/14/21 [phone]

## 2021-07-14 NOTE — ED Triage Notes (Signed)
Pt presents via POV with complaints of bilateral eye pain and drainage for the last 2 days. Since that time he's had insomnia and fatigue. Both eyes are erythematous. Denies CP or SOB.

## 2023-11-13 ENCOUNTER — Emergency Department
Admission: EM | Admit: 2023-11-13 | Discharge: 2023-11-13 | Disposition: A | Discharge status: Home / Self Care | Payer: Self-pay | Attending: Emergency Medicine | Admitting: Emergency Medicine

## 2023-11-13 ENCOUNTER — Other Ambulatory Visit: Payer: Self-pay

## 2023-11-13 DIAGNOSIS — X58XXXA Exposure to other specified factors, initial encounter: Secondary | ICD-10-CM | POA: Insufficient documentation

## 2023-11-13 DIAGNOSIS — S139XXA Sprain of joints and ligaments of unspecified parts of neck, initial encounter: Secondary | ICD-10-CM

## 2023-11-13 DIAGNOSIS — S134XXA Sprain of ligaments of cervical spine, initial encounter: Secondary | ICD-10-CM | POA: Insufficient documentation

## 2023-11-13 MED ORDER — METHOCARBAMOL 500 MG PO TABS: 500.0000 mg | ORAL_TABLET | Freq: Three times a day (TID) | ORAL | 1 refills | Status: AC | PRN

## 2023-11-13 MED ORDER — NAPROXEN 500 MG PO TABS: 500.0000 mg | ORAL_TABLET | Freq: Two times a day (BID) | ORAL | 2 refills | Status: AC

## 2023-11-13 NOTE — ED Provider Notes (Signed)
 [facility] Provider Note    Event Date/Time   First MD Initiated Contact with Patient 11/13/23 [phone]     (approximate)   History   Headache   HPI  Colton Shelton is a 33 y.o. male who presents with complaints of left posterior neck discomfort for about a week.  Patient reports it is painful when he turns his head, he has been using ice packs and Tylenol with little improvement.  Denies injury to the area     Physical Exam   Triage Vital Signs: ED Triage Vitals  Encounter Vitals Group     BP 11/13/23 0912 (!) 162/122     Girls Systolic BP Percentile --      Girls Diastolic BP Percentile --      Boys Systolic BP Percentile --      Boys Diastolic BP Percentile --      Pulse Rate 11/13/23 0912 60     Resp 11/13/23 0912 16     Temp 11/13/23 0912 98.5 F (36.9 C)     Temp Source 11/13/23 0912 Oral     SpO2 11/13/23 0912 98 %     Weight 11/13/23 0911 95.3 kg (210 lb)     Height 11/13/23 0911 1.727 m (5' 8)     Head Circumference --      Peak Flow --      Pain Score 11/13/23 0910 10     Pain Loc --      Pain Education --      Exclude from Growth Chart --     Most recent vital signs: Vitals:   11/13/23 0912  BP: (!) 162/122  Pulse: 60  Resp: 16  Temp: 98.5 F (36.9 C)  SpO2: 98%     General: Awake, no distress.  Well-appearing, no acute distress CV:  Good peripheral perfusion.  Resp:  Normal effort.  Abd:  No distention.  Other:  Posterior upper left lateral tenderness to palpation at the base of the skull consistent with muscle spasm/injury  Neurologically intact, PERRLA   ED Results / Procedures / Treatments   Labs (all labs ordered are listed, but only abnormal results are displayed) Labs Reviewed - No data to display   EKG     RADIOLOGY     PROCEDURES:  Critical Care performed:   Procedures   MEDICATIONS ORDERED IN ED: Medications - No data to display   IMPRESSION / MDM / ASSESSMENT AND  PLAN / ED COURSE  I reviewed the triage vital signs and the nursing notes. Patient's presentation is most consistent with acute, uncomplicated illness.  Patient presents with exam consistent with muscle spasm/sprain, patient has point tenderness in the area, no redness or signs of infection, will treat with NSAIDs, muscle relaxer, recommend outpatient follow-up with PCP        FINAL CLINICAL IMPRESSION(S) / ED DIAGNOSES   Final diagnoses:  Cervical sprain, initial encounter     Rx / DC Orders   ED Discharge Orders          Ordered    naproxen (NAPROSYN) 500 MG tablet  2 times daily with meals        11/13/23 1003    methocarbamol (ROBAXIN) 500 MG tablet  Every 8 hours PRN        11/13/23 1003             Note:  This document was prepared using Dragon voice recognition software and may include unintentional  dictation errors.   Arlander Charleston, MD 11/13/23 [phone]

## 2023-11-13 NOTE — ED Triage Notes (Signed)
 Pt to ED for posterior HA since 10 days. Rates 10/10. Denies vision changes. Appears to be in no acute distress.

## 2023-11-18 ENCOUNTER — Emergency Department: Payer: Self-pay

## 2023-11-18 ENCOUNTER — Emergency Department
Admission: EM | Admit: 2023-11-18 | Discharge: 2023-11-18 | Disposition: A | Discharge status: Home / Self Care | Payer: Self-pay | Attending: Emergency Medicine | Admitting: Emergency Medicine

## 2023-11-18 ENCOUNTER — Other Ambulatory Visit: Payer: Self-pay

## 2023-11-18 DIAGNOSIS — R42 Dizziness and giddiness: Secondary | ICD-10-CM | POA: Insufficient documentation

## 2023-11-18 DIAGNOSIS — R519 Headache, unspecified: Secondary | ICD-10-CM | POA: Insufficient documentation

## 2023-11-18 LAB — CBC
HCT: 45.6 % (ref 39.0–52.0)
Hemoglobin: 15.6 g/dL (ref 13.0–17.0)
MCH: 28.6 pg (ref 26.0–34.0)
MCHC: 34.2 g/dL (ref 30.0–36.0)
MCV: 83.5 fL (ref 80.0–100.0)
Platelets: 306 K/uL (ref 150–400)
RBC: 5.46 MIL/uL (ref 4.22–5.81)
RDW: 13.2 % (ref 11.5–15.5)
WBC: 8.5 K/uL (ref 4.0–10.5)
nRBC: 0 % (ref 0.0–0.2)

## 2023-11-18 LAB — BASIC METABOLIC PANEL WITH GFR
Anion gap: 13 (ref 5–15)
BUN: 14 mg/dL (ref 6–20)
CO2: 27 mmol/L (ref 22–32)
Calcium: 9.4 mg/dL (ref 8.9–10.3)
Chloride: 102 mmol/L (ref 98–111)
Creatinine, Ser: 0.96 mg/dL (ref 0.61–1.24)
GFR, Estimated: >60 mL/min (ref >60)
Glucose, Bld: 113 mg/dL — ABNORMAL HIGH (ref 70–99)
Potassium: 3.8 mmol/L (ref 3.5–5.1)
Sodium: 142 mmol/L (ref 135–145)

## 2023-11-18 MED ORDER — PREDNISONE 10 MG (21) PO TBPK: ORAL_TABLET | ORAL | 0 refills | Status: AC

## 2023-11-18 MED ORDER — ONDANSETRON HCL 4 MG/2ML IJ SOLN
4.0000 mg | Freq: Once | INTRAMUSCULAR | Status: AC
  Administered 2023-11-18: 4 mg via INTRAVENOUS
  Filled 2023-11-18: qty 2

## 2023-11-18 MED ORDER — KETOROLAC TROMETHAMINE 15 MG/ML IJ SOLN
15.0000 mg | Freq: Once | INTRAMUSCULAR | Status: AC
  Administered 2023-11-18: 15 mg via INTRAVENOUS
  Filled 2023-11-18: qty 1

## 2023-11-18 MED ORDER — DEXAMETHASONE SOD PHOSPHATE PF 10 MG/ML IJ SOLN
10.0000 mg | Freq: Once | INTRAMUSCULAR | Status: AC
  Administered 2023-11-18: 10 mg via INTRAVENOUS

## 2023-11-18 MED ORDER — BACLOFEN 10 MG PO TABS: 10.0000 mg | ORAL_TABLET | Freq: Three times a day (TID) | ORAL | 0 refills | Status: AC

## 2023-11-18 MED ORDER — SODIUM CHLORIDE 0.9 % IV BOLUS
1000.0000 mL | Freq: Once | INTRAVENOUS | Status: AC
  Administered 2023-11-18: 1000 mL via INTRAVENOUS

## 2023-11-18 NOTE — ED Provider Notes (Signed)
 [facility] Provider Note    Event Date/Time   First MD Initiated Contact with Patient 11/18/23 1025     (approximate)   History   Headache   HPI  Colton Shelton is a 33 y.o. male no significant past medical history presents emergency department complaining of a headache has been ongoing for for over a week.  Was seen here for neck pain and given methocarbamol and naproxen.  Patient has continued to have pain once the medication wears off.  Is concerned he might have a brain tumor etc.  Denies fever, chills.  No chest pain shortness of breath.  No loss of consciousness dizziness.  No sinus congestion      Physical Exam   Triage Vital Signs: ED Triage Vitals  Encounter Vitals Group     BP 11/18/23 1020 (!) 161/122     Girls Systolic BP Percentile --      Girls Diastolic BP Percentile --      Boys Systolic BP Percentile --      Boys Diastolic BP Percentile --      Pulse Rate 11/18/23 1020 89     Resp 11/18/23 1020 17     Temp 11/18/23 1020 98.1 F (36.7 C)     Temp Source 11/18/23 1020 Oral     SpO2 11/18/23 1020 99 %     Weight 11/18/23 1022 210 lb (95.3 kg)     Height 11/18/23 1022 5' 8 (1.727 m)     Head Circumference --      Peak Flow --      Pain Score 11/18/23 1020 10     Pain Loc --      Pain Education --      Exclude from Growth Chart --     Most recent vital signs: Vitals:   11/18/23 1030 11/18/23 1118  BP: (!) 154/114 (!) 149/102  Pulse: 80 79  Resp:    Temp:    SpO2: 99% 99%     General: Awake, no distress.   CV:  Good peripheral perfusion.  Resp:  Normal effort.  Abd:  No distention.   Other:  PERRL, EOMI, cranial nerves II through XII grossly intact, grips equal bilaterally, 5 or 5 strength all extremities   ED Results / Procedures / Treatments   Labs (all labs ordered are listed, but only abnormal results are displayed) Labs Reviewed  BASIC METABOLIC PANEL WITH GFR - Abnormal; Notable for the  following components:      Result Value   Glucose, Bld 113 (*)    All other components within normal limits  CBC     EKG     RADIOLOGY CT head    PROCEDURES:   Procedures  Critical Care:  no Chief Complaint  Patient presents with   Headache      MEDICATIONS ORDERED IN ED: Medications  sodium chloride  0.9 % bolus 1,000 mL (1,000 mLs Intravenous New Bag/Given 11/18/23 1146)  ondansetron (ZOFRAN) injection 4 mg (4 mg Intravenous Given 11/18/23 1141)  ketorolac (TORADOL) 15 MG/ML injection 15 mg (15 mg Intravenous Given 11/18/23 1141)  dexamethasone (DECADRON) injection 10 mg (10 mg Intravenous Given 11/18/23 1141)     IMPRESSION / MDM / ASSESSMENT AND PLAN / ED COURSE  I reviewed the triage vital signs and the nursing notes.  Differential diagnosis includes, but is not limited to, subdural, SAH, tumor, cervicalgia, tension headache, torticollis, shingles  Patient's presentation is most consistent with acute illness / injury with system symptoms.   Medications given: Patient was given a migraine cocktail with normal saline 1 L IV, Decadron 10 mg, Toradol 15 mg IV, Zofran 4 mg IV  CT of the head, independently reviewed interpreted by me as being negative for any acute abnormality  Patient had relief with the medications given.  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  He was given a prescription for Sterapred and baclofen.  Follow-up with his regular doctor if not improving 2 to 3 days.  Reassurance given that CT was normal.  Work note provided for today.  Return to emergency department if worsening.  Discharged in stable condition.      FINAL CLINICAL IMPRESSION(S) / ED DIAGNOSES   Final diagnoses:  Bad headache     Rx / DC Orders   ED Discharge Orders          Ordered    predniSONE (STERAPRED UNI-PAK 21 TAB) 10 MG (21) TBPK tablet        11/18/23 1300    baclofen (LIORESAL) 10 MG tablet  3 times daily         11/18/23 1300             Note:  This document was prepared using Dragon voice recognition software and may include unintentional dictation errors.    Gasper Devere ORN, PA-C 11/18/23 1316    Floy Roberts, MD 11/18/23 [phone]

## 2023-11-18 NOTE — ED Triage Notes (Signed)
 Pt arrives via POV with c/o head pain that has been going on since last Wednesday. Pt states that they were seen for neck pain that was shooting up to their head. Pt states that they feel 2 knots on the back of their head and the pain feels like lightening bolts. Pt reports some dizziness happening every day. Pt is A&Ox4 and ambulatory during triage.
